# Patient Record
Sex: Male | Born: 1975 | Race: White | Hispanic: No | Marital: Married | Smoking: Current every day smoker
Health system: Southern US, Community
[De-identification: ages and names within clinical notes are randomized; demographics above are authoritative.]

## PROBLEM LIST (undated history)

## (undated) DIAGNOSIS — R569 Unspecified convulsions: Secondary | ICD-10-CM

## (undated) DIAGNOSIS — I10 Essential (primary) hypertension: Secondary | ICD-10-CM

## (undated) DIAGNOSIS — G40109 Localization-related (focal) (partial) symptomatic epilepsy and epileptic syndromes with simple partial seizures, not intractable, without status epilepticus: Secondary | ICD-10-CM

## (undated) DIAGNOSIS — F32A Depression, unspecified: Secondary | ICD-10-CM

## (undated) DIAGNOSIS — F329 Major depressive disorder, single episode, unspecified: Secondary | ICD-10-CM

## (undated) DIAGNOSIS — G43909 Migraine, unspecified, not intractable, without status migrainosus: Secondary | ICD-10-CM

## (undated) DIAGNOSIS — K642 Third degree hemorrhoids: Secondary | ICD-10-CM

## (undated) DIAGNOSIS — K589 Irritable bowel syndrome without diarrhea: Secondary | ICD-10-CM

## (undated) DIAGNOSIS — I499 Cardiac arrhythmia, unspecified: Secondary | ICD-10-CM

## (undated) DIAGNOSIS — K429 Umbilical hernia without obstruction or gangrene: Secondary | ICD-10-CM

## (undated) HISTORY — DX: Depression, unspecified: F32.A

## (undated) HISTORY — PX: OTHER SURGICAL HISTORY: SHX169

## (undated) HISTORY — DX: Localization-related (focal) (partial) symptomatic epilepsy and epileptic syndromes with simple partial seizures, not intractable, without status epilepticus: G40.109

## (undated) HISTORY — DX: Third degree hemorrhoids: K64.2

## (undated) HISTORY — DX: Essential (primary) hypertension: I10

## (undated) HISTORY — DX: Irritable bowel syndrome, unspecified: K58.9

## (undated) HISTORY — DX: Umbilical hernia without obstruction or gangrene: K42.9

## (undated) HISTORY — DX: Migraine, unspecified, not intractable, without status migrainosus: G43.909

---
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
## 2003-09-23 ENCOUNTER — Ambulatory Visit (HOSPITAL_COMMUNITY): Admission: RE | Admit: 2003-09-23 | Discharge: 2003-09-23 | Payer: Self-pay | Admitting: Unknown Physician Specialty

## 2004-06-20 ENCOUNTER — Ambulatory Visit: Payer: Self-pay | Admitting: Family Medicine

## 2004-07-22 ENCOUNTER — Ambulatory Visit (HOSPITAL_COMMUNITY): Admission: RE | Admit: 2004-07-22 | Discharge: 2004-07-22 | Payer: Self-pay | Admitting: *Deleted

## 2004-09-06 ENCOUNTER — Encounter: Admission: RE | Admit: 2004-09-06 | Discharge: 2004-09-06 | Payer: Self-pay | Admitting: Neurology

## 2004-11-01 ENCOUNTER — Encounter (INDEPENDENT_AMBULATORY_CARE_PROVIDER_SITE_OTHER): Payer: Self-pay | Admitting: *Deleted

## 2004-11-01 ENCOUNTER — Ambulatory Visit (HOSPITAL_COMMUNITY): Admission: RE | Admit: 2004-11-01 | Discharge: 2004-11-01 | Payer: Self-pay | Admitting: Cardiology

## 2007-01-15 ENCOUNTER — Ambulatory Visit: Payer: Self-pay | Admitting: Family Medicine

## 2009-11-02 ENCOUNTER — Ambulatory Visit (HOSPITAL_COMMUNITY): Admission: RE | Admit: 2009-11-02 | Discharge: 2009-11-02 | Payer: Self-pay | Admitting: Cardiology

## 2014-09-02 ENCOUNTER — Emergency Department (HOSPITAL_COMMUNITY): Payer: Managed Care, Other (non HMO)

## 2014-09-02 ENCOUNTER — Encounter (HOSPITAL_COMMUNITY): Payer: Self-pay | Admitting: Emergency Medicine

## 2014-09-02 ENCOUNTER — Emergency Department (HOSPITAL_COMMUNITY)
Admission: EM | Admit: 2014-09-02 | Discharge: 2014-09-02 | Disposition: A | Discharge status: Home / Self Care | Payer: Managed Care, Other (non HMO) | Attending: Emergency Medicine | Admitting: Emergency Medicine

## 2014-09-02 DIAGNOSIS — R000 Tachycardia, unspecified: Secondary | ICD-10-CM | POA: Insufficient documentation

## 2014-09-02 DIAGNOSIS — Z880 Allergy status to penicillin: Secondary | ICD-10-CM | POA: Diagnosis not present

## 2014-09-02 DIAGNOSIS — K644 Residual hemorrhoidal skin tags: Secondary | ICD-10-CM | POA: Diagnosis not present

## 2014-09-02 DIAGNOSIS — R1032 Left lower quadrant pain: Secondary | ICD-10-CM | POA: Diagnosis present

## 2014-09-02 DIAGNOSIS — Z720 Tobacco use: Secondary | ICD-10-CM | POA: Diagnosis not present

## 2014-09-02 DIAGNOSIS — Z8669 Personal history of other diseases of the nervous system and sense organs: Secondary | ICD-10-CM | POA: Diagnosis not present

## 2014-09-02 HISTORY — DX: Unspecified convulsions: R56.9

## 2014-09-02 LAB — TROPONIN I: TROPONIN I: 0.03 ng/mL (ref <0.031)

## 2014-09-02 LAB — PROTIME-INR
INR: 1.23 (ref 0.00–1.49)
PROTHROMBIN TIME: 15.6 s — AB (ref 11.6–15.2)

## 2014-09-02 LAB — CBC WITH DIFFERENTIAL/PLATELET
BASOS PCT: 0 % (ref 0–1)
Basophils Absolute: 0 10*3/uL (ref 0.0–0.1)
EOS ABS: 0 10*3/uL (ref 0.0–0.7)
EOS PCT: 0 % (ref 0–5)
HCT: 41.4 % (ref 39.0–52.0)
Hemoglobin: 14 g/dL (ref 13.0–17.0)
Lymphocytes Relative: 30 % (ref 12–46)
Lymphs Abs: 2.2 10*3/uL (ref 0.7–4.0)
MCH: 29 pg (ref 26.0–34.0)
MCHC: 33.8 g/dL (ref 30.0–36.0)
MCV: 85.7 fL (ref 78.0–100.0)
MONOS PCT: 13 % — AB (ref 3–12)
Monocytes Absolute: 1 10*3/uL (ref 0.1–1.0)
NEUTROS PCT: 57 % (ref 43–77)
Neutro Abs: 4.2 10*3/uL (ref 1.7–7.7)
Platelets: 138 10*3/uL — ABNORMAL LOW (ref 150–400)
RBC: 4.83 MIL/uL (ref 4.22–5.81)
RDW: 15.3 % (ref 11.5–15.5)
WBC: 7.5 10*3/uL (ref 4.0–10.5)

## 2014-09-02 LAB — COMPREHENSIVE METABOLIC PANEL
ALBUMIN: 4.3 g/dL (ref 3.5–5.2)
ALK PHOS: 85 U/L (ref 39–117)
ALT: 17 U/L (ref 0–53)
ANION GAP: 7 (ref 5–15)
AST: 26 U/L (ref 0–37)
BUN: 5 mg/dL — AB (ref 6–23)
CHLORIDE: 97 mmol/L (ref 96–112)
CO2: 27 mmol/L (ref 19–32)
Calcium: 9 mg/dL (ref 8.4–10.5)
Creatinine, Ser: 1.33 mg/dL (ref 0.50–1.35)
GFR calc non Af Amer: 66 mL/min — ABNORMAL LOW (ref >90)
GFR, EST AFRICAN AMERICAN: 77 mL/min — AB (ref >90)
Glucose, Bld: 133 mg/dL — ABNORMAL HIGH (ref 70–99)
POTASSIUM: 3.8 mmol/L (ref 3.5–5.1)
Sodium: 131 mmol/L — ABNORMAL LOW (ref 135–145)
TOTAL PROTEIN: 7.4 g/dL (ref 6.0–8.3)
Total Bilirubin: 1.5 mg/dL — ABNORMAL HIGH (ref 0.3–1.2)

## 2014-09-02 MED ORDER — DILTIAZEM HCL ER COATED BEADS 120 MG PO CP24: 120.0000 mg | ORAL_CAPSULE | Freq: Every day | ORAL | Status: DC

## 2014-09-02 MED ORDER — DILTIAZEM HCL ER COATED BEADS 120 MG PO CP24
120.0000 mg | ORAL_CAPSULE | Freq: Once | ORAL | Status: AC
  Administered 2014-09-02: 120 mg via ORAL
  Filled 2014-09-02: qty 1

## 2014-09-02 MED ORDER — DILTIAZEM HCL 100 MG IV SOLR
5.0000 mg/h | INTRAVENOUS | Status: DC
  Administered 2014-09-02: 5 mg/h via INTRAVENOUS

## 2014-09-02 MED ORDER — DILTIAZEM LOAD VIA INFUSION
15.0000 mg | Freq: Once | INTRAVENOUS | Status: AC
  Administered 2014-09-02: 15 mg via INTRAVENOUS
  Filled 2014-09-02: qty 15

## 2014-09-02 NOTE — ED Notes (Signed)
Pt c/o hemorrhoid pain x 2 days.

## 2014-09-02 NOTE — ED Provider Notes (Signed)
CSN: [id number]     Arrival date & time 09/02/14  0530 History   First MD Initiated Contact with Patient 09/02/14 [phone]     Chief Complaint  Patient presents with  . Hemorrhoids      HPI Patient presents to the emergency department complaining of hemorrhoid pain and a small amount of bleeding today.  He also reported that he developed a severe pain in his left groin.  On arrival to emergency department the patient has a heart rate in the 170s.  He denies chest pain shortness of breath.  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  He has no additional knowledge of his congenital heart disease.  He is to follow with cardiology but has not seen a cardiologist since 2006.  At that time it was Dr. Gery PrayBarry [place] in [place].  He denies medication use.  He denies exertional shortness of breath.  He states he was scheduled to have his hemorrhoid worked on in [facility] several months ago but when his heart rate was noted by the anesthesiologist the case was canceled and the patient was instructed that he would need cardiology follow-up prior to surgical procedure.  Past Medical History  Diagnosis Date  . Seizures    Past Surgical History  Procedure Laterality Date  . Pediatric bowel surgery    . Pediatric heart surgery     History reviewed. No pertinent family history. History  Substance Use Topics  . Smoking status: Current Every Day Smoker    Types: Cigarettes  . Smokeless tobacco: Not on file  . Alcohol Use: No    Review of Systems  All other systems reviewed and are negative.     Allergies  Aspirin; Nsaids; and Penicillins  Home Medications   Prior to Admission medications   Not on File   BP 116/65 mmHg  Pulse  148  Temp(Src) 98.3 F (36.8 C) (Oral)  Resp 26  Ht 5\' 11"  (1.803 m)  Wt 190 lb (86.183 kg)  BMI 26.51 kg/m2  SpO2 94% Physical Exam  Constitutional: He is oriented to person, place, and time. He appears well-developed and well-nourished.  HENT:  Head: Normocephalic and atraumatic.  Eyes: EOM are normal.  Neck: Normal range of motion.  Cardiovascular: Normal rate, regular rhythm, normal heart sounds and intact distal pulses.   Pulmonary/Chest: Effort normal and breath sounds normal. No respiratory distress.  Abdominal: Soft. He exhibits no distension. There is no tenderness.  Genitourinary:  Nonbleeding grade 2 hemorrhoid without tenderness.  Musculoskeletal: Normal range of motion.  Neurological: He is alert and oriented to person, place, and time.  Skin: Skin is warm and dry.  Psychiatric: He has a normal mood and affect. Judgment normal.  Nursing note and vitals reviewed.   ED Course  Procedures (including critical care time) Labs Review Labs Reviewed  CBC WITH DIFFERENTIAL/PLATELET - Abnormal; Notable for the following:    Platelets 138 (*)    Monocytes Relative 13 (*)    All other components within normal limits  COMPREHENSIVE METABOLIC PANEL - Abnormal; Notable for the following:    Sodium 131 (*)    Glucose, Bld 133 (*)    BUN 5 (*)    Total Bilirubin  1.5 (*)    GFR calc non Af Amer 66 (*)    GFR calc Af Amer 77 (*)    All other components within normal limits  PROTIME-INR - Abnormal; Notable for the following:    Prothrombin Time 15.6 (*)    All other components within normal limits  TROPONIN I    Imaging Review Dg Chest Portable 1 View  09/02/2014   CLINICAL DATA:  New onset atrial fibrillation.  Rapid heart rate.  EXAM: PORTABLE CHEST - 1 VIEW  COMPARISON:  None.  FINDINGS: Cardiac enlargement with normal pulmonary vascularity. Slight fibrosis or atelectasis in the right lung base. No focal consolidation. No pneumothorax.  IMPRESSION: Cardiac enlargement.  Fibrosis or atelectasis in the right lung base.   Electronically Signed   By: Burman Nieves M.D.   On: 09/02/2014 06:45  I personally reviewed the imaging tests through PACS system I reviewed available ER/hospitalization records through the EMR    EKG Interpretation #1  Date/Time:  Wednesday September 02 2014 06:02:58 EST Ventricular Rate:  178 PR Interval:  106 QRS Duration: 136 QT Interval:  277 QTC Calculation: 477 R Axis:   -91 Text Interpretation:  atrial fibrillation with RVR RBBB and LAFB Confirmed  by Dhiya Smits  MD, Caryn Bee ([id number]) on 09/02/2014 7:30:41 AM      MDM   Final diagnoses:  External hemorrhoid  Tachycardia    From a hemorrhoid standpoint the patient is fine to be discharged from the emergency department with Anusol and general surgery follow-up.  He will however need cardiac clearance for this.  I've asked that cardiology see the patient emergency department.  I think this could potentially be managed as an outpatient if cardiology were to follow along closely, schedule his appointment, and she medications.  It likely would be faster and easier the patient was admitted for workup to include echocardiogram to further define his cardiac anatomy.  Initial EKG appears to be A. fib with RVR with a right bundle branch block although at times I see occasional P waves through his EKG.  Patient was started on a Cardizem drip.  His heart rate is now improved to 112 and appears to be sinus rhythm at this time.  This is a difficult EKG to interpret given the fast heart rate.  At one point his heart rate while in the emergency department when up to 205.    Lyanne Co, MD 09/02/14 [phone]

## 2014-09-02 NOTE — Discharge Instructions (Signed)
If you were given medicines take as directed.  If you are on coumadin or contraceptives realize their levels and effectiveness is altered by many different medicines.  If you have any reaction (rash, tongues swelling, other) to the medicines stop taking and see a physician.   Please follow up as directed and return to the ER or see a physician for new or worsening symptoms.  Thank you. Filed Vitals:   09/02/14 0645 09/02/14 0650 09/02/14 0715 09/02/14 0730  BP: 104/64 116/65 72/59 113/83  Pulse: 75 148 118 112  Temp:      TempSrc:      Resp: 18 26 16 14   Height:      Weight:      SpO2: 94% 94% 93% 95%

## 2014-09-02 NOTE — ED Provider Notes (Signed)
Patient signed out to follow cardiology recommendations. Cardiology called and recommended outpatient follow-up, recommended standard release 120 mg once per day. Cardiology will help arrange appointment for the patient.  Filed Vitals:   09/02/14 0645 09/02/14 0650 09/02/14 0715 09/02/14 0730  BP: 104/64 116/65 72/59 113/83  Pulse: 75 148 118 112  Temp:      TempSrc:      Resp: 18 26 16 14   Height:      Weight:      SpO2: 94% 94% 93% 95%   Tachycardia, Hemorrhoid  Enid SkeensJoshua M Deedra Pro, MD 09/02/14 [id number]

## 2014-09-02 NOTE — ED Notes (Signed)
Pt handed an urinal to use 

## 2014-09-08 ENCOUNTER — Ambulatory Visit (INDEPENDENT_AMBULATORY_CARE_PROVIDER_SITE_OTHER): Payer: Managed Care, Other (non HMO) | Admitting: Cardiology

## 2014-09-08 ENCOUNTER — Encounter: Payer: Self-pay | Admitting: Cardiology

## 2014-09-08 VITALS — BP 118/62 | HR 82 | Ht 71.0 in | Wt 179.8 lb

## 2014-09-08 DIAGNOSIS — K648 Other hemorrhoids: Secondary | ICD-10-CM | POA: Insufficient documentation

## 2014-09-08 DIAGNOSIS — I4819 Other persistent atrial fibrillation: Secondary | ICD-10-CM

## 2014-09-08 DIAGNOSIS — I481 Persistent atrial fibrillation: Secondary | ICD-10-CM

## 2014-09-08 DIAGNOSIS — Q249 Congenital malformation of heart, unspecified: Secondary | ICD-10-CM

## 2014-09-08 LAB — TSH: TSH: 0.93 u[IU]/mL (ref 0.35–4.50)

## 2014-09-08 MED ORDER — DILTIAZEM HCL ER COATED BEADS 240 MG PO CP24: 240.0000 mg | ORAL_CAPSULE | Freq: Every day | ORAL | Status: DC

## 2014-09-08 NOTE — Progress Notes (Signed)
[address] [place], [place]  [address] Phone: [phone] Fax:  [phone]  Date:  09/08/2014   ID:  Cole Reynolds, DOB 02/25/1976, MRN [id number]  PCP:  No PCP Per Patient   History of Present Illness: Cole Reynolds is a 39 y.o. male here for evaluation of tachycardia, heart rate in the 170 range. His first EKG on 09/02/14 at 6 AM shows atrial fibrillation with heart rate in the 170 pattern with variable conduction. On repeat at 7 AM 09/02/14 EKG demonstrates what appears to be 2-1 atrial flutter pattern with heart rate of 120 bpm. 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 Perhaps AV canal defect. Question complete AV canal defect. 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 He saw Dr. Allyson SabalBerry years ago in 2006. Hemorrhoid surgery procedure was canceled by anesthesia.  ECHO 2006:  - Overall left ventricular systolic function was normal. Left    ventricular ejection fraction was estimated , range being 55    % to 65 %.. There were no left ventricular regional wall    motion abnormalities. - There was trivial aortic valvular regurgitation. - There was no left atrial appendage thrombus identified. - RU PV normal size and position with normal Doppler flow. RL    pulmonary vein normal size and position. No suggestion    anomalous right pulmonary veins. - There was no right-to-left shunt at rest by contrast study with    agitated saline. There was no right-to-left shunt with cough    by contrast study with agitated saline. There was no    right-to-left shunt during Valsalva maneuver by contrast    study with agitated saline. There was no evidence for left to    right shunt by contrast study with agitated saline (negative    contrast effect).  Cut out a lot  of caffeine [facility](Mountain Dew), and cut back on sugar (glucose 133 in emergency room) and smoker.  Dr. Allyson SabalBerry had on warfarin but he "didn't like it one bit."  The emergency room sent home on diltiazem 120 mg once a day.   Wt Readings from Last 3 Encounters:  09/08/14 179 lb 12.8 oz (81.557 kg)  09/02/14 190 lb (86.183 kg)     Past Medical History  Diagnosis Date  . Seizures     Past Surgical History  Procedure Laterality Date  . Pediatric bowel surgery    . Pediatric heart surgery      Current Outpatient Prescriptions  Medication Sig Dispense Refill  . diazepam (VALIUM) 10 MG tablet Take 10 mg by mouth daily.    Marland Kitchen. diltiazem (DILTIAZEM CD) 120 MG 24 hr capsule Take 1 capsule (120 mg total) by mouth daily. 30 capsule 0  . gabapentin (NEURONTIN) 600 MG tablet Take 600 mg by mouth 2 (two) times daily.     No current facility-administered medications for this visit.    Allergies:    Allergies  Allergen Reactions  . Aspirin Other (See Comments)    unknown  . Nsaids Hives  . Penicillins Other (See Comments)    unknown    Social History:  The patient  reports that he has been smoking Cigarettes.  He does not have any smokeless tobacco history on file. He reports that he uses illicit drugs (Marijuana).  He reports that he does not drink alcohol.   No early family history of coronary artery disease or congenital heart disease  ROS:  Please see the history of present illness.   Positive for pain associated with his hemorrhoid. No syncope, no strokelike symptoms. No rashes.   All other systems reviewed and negative.   PHYSICAL EXAM: VS:  BP 118/62 mmHg  Pulse 82  Ht  (1.803 m)  Wt 179 lb 12.8 oz (81.557 kg)  BMI 25.09 kg/m2 Well nourished, well developed, in no acute distress HEENT: normal, [place]/AT, EOMI Neck: no JVD, normal carotid upstroke, no bruit Cardiac:  normal S1, S2; RRR; no murmur Lungs:  clear to auscultation bilaterally, no wheezing, rhonchi or rales Abd: soft,  nontender, no hepatomegaly, no bruits Ext: no edema, 2+ distal pulses Skin: warm and dry GU: deferred Neuro: no focal abnormalities noted, AAO x 3  EKG:  Atrial fibrillation with rapid ventricular response heart rate in the 170s on 09/02/14-repeat appears to be 2-1 atrial flutter   120  ASSESSMENT AND PLAN:  1. Atrial fibrillation/flutter-this goes hand-in-hand with his underlying congenital heart disease. Apparently cathetered by history he has had atrial fibrillation for several years. At one point in 2006 he had been on warfarin therapy but discontinued. CHADS-VASc - 0. He has never had a prior stroke. He is not interested in anticoagulation or anything that would make his hemorrhoidal bleeding worse. Ultimately, my goal is to provide adequate rate control for his atrial fibrillation would then allow for him to have hemorrhoid repair. We will increase his diltiazem from 120 up to 240 mg once a day. I will see him back in a few days. If necessary, we will increase this to 360. We also add metoprolol if necessary in the future for further rate control. It would be my suspicion that given his long-standing history of atrial fibrillation that maintenance of sinus rhythm would be quite challenging, especially given his underlying history of congenital heart defect with repair. If we are unsuccessful at rate control, I may ask electrophysiology colleagues to ponder further options. I will repeat echocardiogram since it is been over 10 years and I will also check TSH. 2. We will see him back on Friday  Signed, Donato Schultz, MD [facility]  09/08/2014 10:24 AM

## 2014-09-08 NOTE — Patient Instructions (Signed)
Please increase your Diltiazem to 240 mg a day. Continue all other medications as listed.  Your physician has requested that you have an echocardiogram. Echocardiography is a painless test that uses sound waves to create images of your heart. It provides your doctor with information about the size and shape of your heart and how well your heart's chambers and valves are working. This procedure takes approximately one hour. There are no restrictions for this procedure.  Please have blood work today  (TSH).  Follow up in 2 to 3 days with Dr Anne FuSkains to follow up heart rate/ At Fib.

## 2014-09-09 ENCOUNTER — Ambulatory Visit (HOSPITAL_COMMUNITY): Payer: Managed Care, Other (non HMO) | Attending: Cardiology

## 2014-09-09 DIAGNOSIS — I481 Persistent atrial fibrillation: Secondary | ICD-10-CM | POA: Insufficient documentation

## 2014-09-09 DIAGNOSIS — I4819 Other persistent atrial fibrillation: Secondary | ICD-10-CM

## 2014-09-09 DIAGNOSIS — I4891 Unspecified atrial fibrillation: Secondary | ICD-10-CM

## 2014-09-09 NOTE — Progress Notes (Signed)
2D Echo completed. 09/09/2014 

## 2014-09-10 ENCOUNTER — Telehealth: Payer: Self-pay | Admitting: Cardiology

## 2014-09-10 ENCOUNTER — Other Ambulatory Visit: Payer: Self-pay | Admitting: *Deleted

## 2014-09-10 MED ORDER — DIGOXIN 125 MCG PO TABS: 0.1250 mg | ORAL_TABLET | Freq: Every day | ORAL | Status: DC

## 2014-09-10 MED ORDER — METOPROLOL SUCCINATE ER 100 MG PO TB24: 100.0000 mg | ORAL_TABLET | Freq: Every day | ORAL | Status: DC

## 2014-09-10 NOTE — Telephone Encounter (Signed)
Pt calling because he wanted to know if he should start his new medications today.  Advised that he should start them this evening when he picks them up.  He states understanding.

## 2014-09-10 NOTE — Telephone Encounter (Signed)
New message      [facility]

## 2014-09-11 ENCOUNTER — Ambulatory Visit (INDEPENDENT_AMBULATORY_CARE_PROVIDER_SITE_OTHER): Payer: Managed Care, Other (non HMO) | Admitting: Cardiology

## 2014-09-11 ENCOUNTER — Encounter: Payer: Self-pay | Admitting: Cardiology

## 2014-09-11 ENCOUNTER — Ambulatory Visit: Payer: Managed Care, Other (non HMO) | Admitting: Cardiology

## 2014-09-11 VITALS — BP 100/72 | HR 104 | Ht 71.0 in | Wt 181.0 lb

## 2014-09-11 DIAGNOSIS — I5021 Acute systolic (congestive) heart failure: Secondary | ICD-10-CM | POA: Insufficient documentation

## 2014-09-11 DIAGNOSIS — I481 Persistent atrial fibrillation: Secondary | ICD-10-CM

## 2014-09-11 DIAGNOSIS — I4819 Other persistent atrial fibrillation: Secondary | ICD-10-CM

## 2014-09-11 MED ORDER — FUROSEMIDE 20 MG PO TABS: 20.0000 mg | ORAL_TABLET | Freq: Every day | ORAL | Status: DC

## 2014-09-11 NOTE — Progress Notes (Signed)
[address] [place], [place]  [address] Phone: [phone](336) [phone] Fax:  [phone](336) [phone]  Date:  09/11/2014   ID:  Cole Reynolds, DOB 05/17/1976, MRN [id number]  PCP:  No PCP Per Patient   History of Present Illness: Cole Reynolds is a 39 y.o. male here for lump of atrial fibrillation/flutter with rapid ventricular response up to 170 bpm. Originally he was placed on diltiazem however when echocardiogram was performed, his ejection fraction had reduced from 60% 10 years ago down to 15%. Because of this, we transitioned him over to metoprolol and digoxin. Rate control today is better. 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 He is feeling some orthopnea that is periodic.   Prior heart rate in the 170 range. His first EKG on 09/02/14 at 6 AM shows atrial fibrillation with heart rate in the 170 pattern with variable conduction. On repeat at 7 AM 09/02/14 EKG demonstrates what appears to be 2-1 atrial flutter pattern with heart rate of 120 bpm. 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 Perhaps AV canal defect. Question complete AV canal defect. 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 He saw Dr. Allyson SabalBerry years ago in 2006. Hemorrhoid surgery procedure was canceled by anesthesia.  ECHO 2006: Normal ejection fraction, no shunt with agitated saline.  Echocardiogram 2016-EF 15%  - Cut out a lot of caffeine [facility](Mountain Dew), and cut back on sugar (glucose 133 in emergency room) and smoker.  Dr. Allyson SabalBerry had on warfarin but he "didn't like it one bit."  The emergency room sent home on diltiazem 120 mg once a day.   Wt Readings from Last 3 Encounters:  09/11/14 181 lb (82.101 kg)  09/08/14 179 lb 12.8 oz (81.557 kg)  09/02/14 190 lb (86.183 kg)     Past Medical History  Diagnosis Date  . Seizures     Past  Surgical History  Procedure Laterality Date  . Pediatric bowel surgery    . Pediatric heart surgery      Current Outpatient Prescriptions  Medication Sig Dispense Refill  . diazepam (VALIUM) 10 MG tablet Take 10 mg by mouth daily.    . digoxin (LANOXIN) 0.125 MG tablet Take 1 tablet (0.125 mg total) by mouth daily. 30 tablet 6  . gabapentin (NEURONTIN) 600 MG tablet Take 600 mg by mouth 2 (two) times daily.    . metoprolol succinate (TOPROL-XL) 100 MG 24 hr tablet Take 1 tablet (100 mg total) by mouth daily. Take with or immediately following a meal. 30 tablet 6   No current facility-administered medications for this visit.    Allergies:    Allergies  Allergen Reactions  . Aspirin Other (See Comments)    unknown  . Nsaids Hives  . Penicillins Other (See Comments)    unknown    Social History:  The patient  reports that he has been smoking Cigarettes.  He does not have any smokeless tobacco history on file. He reports that he uses illicit drugs (Marijuana). He reports that he does not drink alcohol.   No early family history of coronary artery disease or congenital heart disease  ROS:  Please see the history of present illness.   Positive for pain associated with his hemorrhoid. No syncope, no strokelike symptoms. No rashes.   All other  systems reviewed and negative.   PHYSICAL EXAM: VS:  BP 100/72 mmHg  Pulse 104  Ht  (1.803 m)  Wt 181 lb (82.101 kg)  BMI 25.26 kg/m2 Well nourished, well developed, in no acute distress HEENT: normal, [place]/AT, EOMI Neck: noobvious JVD, normal carotid upstroke, no bruit Cardiac:  normal S1, S2; RRR; no murmur Lungs:  clear to auscultation bilaterally, no wheezing, minimal right lower lobe crackles  Abd: soft, nontender, no hepatomegaly, no bruits Ext: no edema, 2+ distal pulses Skin: warm and dry GU: deferred Neuro: no focal abnormalities noted, AAO x 3  EKG:  Atrial fibrillation with rapid ventricular response heart rate in the  170s on 09/02/14-repeat appears to be 2-1 atrial flutter   120  ASSESSMENT AND PLAN:  1. Atrial fibrillation/flutter-has had rapid ventricular response. 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 His ejection fraction currently is 15%. 10 years ago it was normal. We switched him from diltiazem over to Toprol because of his cardiomyopathy. After's first dose of Toprol he did feel nausea and had an episode of vomiting/diarrhea. We have asked him to continue to try to take this medication. He has had some orthopnea as well. This is periodic. He is also a heavy smoker. He has mild right lower lobe crackles on exam. I will give him Lasix 20 mg once a day. Last creatinine was 1.3. His atrial fibrillation/flutter goes hand-in-hand with his underlying congenital heart disease. Apparently by history he has had atrial fibrillation for several years. At one point in 2006 he had been on warfarin therapy but discontinued. CHADS-VASc - 1. With newly discovered ejection fraction of 15%. He has never had a prior stroke. He is not interested in anticoagulation or anything that would make his hemorrhoidal bleeding worse. Ultimately, my goal is to provide adequate rate control for his atrial fibrillation would then allow for him to have hemorrhoid repair. It would be my suspicion that given his long-standing history of atrial fibrillation that maintenance of sinus rhythm would be quite challenging, especially given his underlying history of congenital heart defect with repair. If we are unsuccessful at rate control, I may ask electrophysiology colleagues to ponder further options. TSH is normal. 2. We will see him back week. I've told him to have a low threshold to go to the emergency room if his symptoms worsen.  Signed, Donato Schultz, MD [facility]  09/11/2014 9:02 AM

## 2014-09-11 NOTE — Patient Instructions (Addendum)
Please start Furosemide 20 mg a day.  You will want to take this medication in the morning. Continue all other medications as listed.  Increase food high in potassium as discussed.  You will have blood work next week to check your potassium level and kidney functions.  Follow up in 1 week with Dr Anne FuSkains.  Thank you for choosing [facility]!!

## 2014-09-16 ENCOUNTER — Ambulatory Visit (INDEPENDENT_AMBULATORY_CARE_PROVIDER_SITE_OTHER): Payer: Managed Care, Other (non HMO) | Admitting: Cardiology

## 2014-09-16 ENCOUNTER — Encounter: Payer: Self-pay | Admitting: Cardiology

## 2014-09-16 VITALS — BP 90/58 | HR 139 | Ht 71.0 in | Wt 177.0 lb

## 2014-09-16 DIAGNOSIS — I4892 Unspecified atrial flutter: Secondary | ICD-10-CM

## 2014-09-16 NOTE — Patient Instructions (Signed)
Your physician recommends that you schedule a follow-up appointment in: 1 month with Dr. Skains  Your physician recommends that you continue on your current medications as directed. Please refer to the Current Medication list given to you today.  
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
## 2014-10-20 ENCOUNTER — Ambulatory Visit (INDEPENDENT_AMBULATORY_CARE_PROVIDER_SITE_OTHER): Payer: Managed Care, Other (non HMO) | Admitting: Cardiology

## 2014-10-20 ENCOUNTER — Encounter: Payer: Self-pay | Admitting: Cardiology

## 2014-10-20 VITALS — BP 102/69 | HR 95 | Ht 71.0 in | Wt 183.0 lb

## 2014-10-20 DIAGNOSIS — Z720 Tobacco use: Secondary | ICD-10-CM

## 2014-10-20 DIAGNOSIS — I481 Persistent atrial fibrillation: Secondary | ICD-10-CM

## 2014-10-20 DIAGNOSIS — I420 Dilated cardiomyopathy: Secondary | ICD-10-CM | POA: Insufficient documentation

## 2014-10-20 DIAGNOSIS — I4819 Other persistent atrial fibrillation: Secondary | ICD-10-CM

## 2014-10-20 DIAGNOSIS — Q249 Congenital malformation of heart, unspecified: Secondary | ICD-10-CM

## 2014-10-20 LAB — BASIC METABOLIC PANEL
BUN: 7 mg/dL (ref 6–23)
CALCIUM: 9.5 mg/dL (ref 8.4–10.5)
CO2: 31 meq/L (ref 19–32)
Chloride: 107 mEq/L (ref 96–112)
Creatinine, Ser: 1.03 mg/dL (ref 0.40–1.50)
GFR: 85.25 mL/min (ref >60.00)
GLUCOSE: 88 mg/dL (ref 70–99)
Potassium: 3.5 mEq/L (ref 3.5–5.1)
Sodium: 141 mEq/L (ref 135–145)

## 2014-10-20 MED ORDER — METOPROLOL SUCCINATE ER 50 MG PO TB24: 50.0000 mg | ORAL_TABLET | Freq: Every day | ORAL | Status: DC
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
## 2014-10-20 NOTE — Patient Instructions (Signed)
Please take Metoprolol 50 mg in the AM and 100 mg in the PM. Continue all other medications as listed.  Please have blood work today (BMP)  Your physician has requested that you have an echocardiogram in 6 weeks. Echocardiography is a painless test that uses sound waves to create images of your heart. It provides your doctor with information about the size and shape of your heart and how well your heart's chambers and valves are working. This procedure takes approximately one hour. There are no restrictions for this procedure.  Follow up in 6 weeks with Dr Anne FuSkains.  Thank you for choosing [facility]!!

## 2014-12-01 ENCOUNTER — Ambulatory Visit (HOSPITAL_COMMUNITY): Payer: Managed Care, Other (non HMO) | Attending: Cardiology | Admitting: Cardiology

## 2014-12-01 ENCOUNTER — Ambulatory Visit (INDEPENDENT_AMBULATORY_CARE_PROVIDER_SITE_OTHER): Payer: Managed Care, Other (non HMO) | Admitting: Cardiology

## 2014-12-01 ENCOUNTER — Encounter: Payer: Self-pay | Admitting: Cardiology

## 2014-12-01 VITALS — BP 98/70 | HR 52 | Ht 71.0 in | Wt 191.2 lb

## 2014-12-01 DIAGNOSIS — I4819 Other persistent atrial fibrillation: Secondary | ICD-10-CM

## 2014-12-01 DIAGNOSIS — I480 Paroxysmal atrial fibrillation: Secondary | ICD-10-CM | POA: Diagnosis not present

## 2014-12-01 DIAGNOSIS — I481 Persistent atrial fibrillation: Secondary | ICD-10-CM

## 2014-12-01 DIAGNOSIS — Z720 Tobacco use: Secondary | ICD-10-CM | POA: Insufficient documentation

## 2014-12-01 DIAGNOSIS — I420 Dilated cardiomyopathy: Secondary | ICD-10-CM

## 2014-12-01 DIAGNOSIS — I484 Atypical atrial flutter: Secondary | ICD-10-CM

## 2014-12-01 DIAGNOSIS — Q249 Congenital malformation of heart, unspecified: Secondary | ICD-10-CM | POA: Diagnosis not present

## 2014-12-01 MED ORDER — RIVAROXABAN 20 MG PO TABS: 20.0000 mg | ORAL_TABLET | Freq: Every day | ORAL | Status: DC

## 2014-12-01 NOTE — Patient Instructions (Addendum)
Medication Instructions:  Start Xarelto 20 mg a day. Continue all other medications as listed.  Labwork: NONE today  Testing/Procedures: Your physician has requested that you have a Cardioversion.  Electrical Cardioversion uses a jolt of electricity to your heart either through paddles or wired patches attached to your chest. This is a controlled, usually prescheduled, procedure. This procedure is done at the hospital and you are not awake during the procedure. You usually go home the day of the procedure. Please see the instruction sheet given to you today for more information. (possibly May 19th)  Follow-Up:  2 weeks after cardioversion.  Thank you for choosing [facility]!!
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
## 2014-12-01 NOTE — Progress Notes (Signed)
Limited echo performed. 

## 2014-12-21 ENCOUNTER — Telehealth: Payer: Self-pay | Admitting: *Deleted

## 2014-12-21 ENCOUNTER — Encounter: Payer: Self-pay | Admitting: *Deleted

## 2014-12-21 NOTE — Telephone Encounter (Signed)
Spoke with wife who is agreeable to schedule patient for Thursday.   Called central scheduling - pt is on for Thursday 5/19 at 11 am case # [id number]  Wife is aware and instruction reviewed with her.  She stated understanding.

## 2014-12-21 NOTE — Telephone Encounter (Signed)
Spoke with pt who reports he has not missed any doses of Xarelto.  He is aware he is ready to be scheduled for out pt cardioversion and Dr Anne FuSkains is available to do this on Thursday this week.  Pt would like his wife to call back to schedule the cardioversion.  I will await her call.

## 2014-12-24 ENCOUNTER — Encounter (HOSPITAL_COMMUNITY): Payer: Self-pay | Admitting: *Deleted

## 2014-12-24 ENCOUNTER — Ambulatory Visit (HOSPITAL_COMMUNITY): Payer: Managed Care, Other (non HMO) | Admitting: Anesthesiology

## 2014-12-24 ENCOUNTER — Encounter (HOSPITAL_COMMUNITY)
Admission: RE | Disposition: A | Discharge status: Home / Self Care | Payer: Self-pay | Source: Ambulatory Visit | Attending: Cardiology

## 2014-12-24 ENCOUNTER — Ambulatory Visit (HOSPITAL_COMMUNITY)
Admission: RE | Admit: 2014-12-24 | Discharge: 2014-12-24 | Disposition: A | Discharge status: Home / Self Care | Payer: Managed Care, Other (non HMO) | Source: Ambulatory Visit | Attending: Cardiology | Admitting: Cardiology

## 2014-12-24 DIAGNOSIS — I4819 Other persistent atrial fibrillation: Secondary | ICD-10-CM | POA: Diagnosis present

## 2014-12-24 DIAGNOSIS — I481 Persistent atrial fibrillation: Secondary | ICD-10-CM

## 2014-12-24 DIAGNOSIS — I429 Cardiomyopathy, unspecified: Secondary | ICD-10-CM | POA: Insufficient documentation

## 2014-12-24 DIAGNOSIS — Z880 Allergy status to penicillin: Secondary | ICD-10-CM | POA: Diagnosis not present

## 2014-12-24 DIAGNOSIS — I4892 Unspecified atrial flutter: Secondary | ICD-10-CM | POA: Insufficient documentation

## 2014-12-24 DIAGNOSIS — I4891 Unspecified atrial fibrillation: Secondary | ICD-10-CM | POA: Diagnosis not present

## 2014-12-24 DIAGNOSIS — Z888 Allergy status to other drugs, medicaments and biological substances status: Secondary | ICD-10-CM | POA: Diagnosis not present

## 2014-12-24 DIAGNOSIS — F1210 Cannabis abuse, uncomplicated: Secondary | ICD-10-CM | POA: Diagnosis not present

## 2014-12-24 DIAGNOSIS — F17210 Nicotine dependence, cigarettes, uncomplicated: Secondary | ICD-10-CM | POA: Diagnosis not present

## 2014-12-24 HISTORY — PX: CARDIOVERSION: SHX1299

## 2014-12-24 HISTORY — DX: Cardiac arrhythmia, unspecified: I49.9

## 2014-12-24 LAB — POCT I-STAT 4, (NA,K, GLUC, HGB,HCT)
Glucose, Bld: 112 mg/dL — ABNORMAL HIGH (ref 65–99)
HCT: 47 % (ref 39.0–52.0)
HEMOGLOBIN: 16 g/dL (ref 13.0–17.0)
POTASSIUM: 3.8 mmol/L (ref 3.5–5.1)
SODIUM: 141 mmol/L (ref 135–145)

## 2014-12-24 SURGERY — CARDIOVERSION
Anesthesia: Monitor Anesthesia Care

## 2014-12-24 MED ORDER — SODIUM CHLORIDE 0.9 % IV SOLN
INTRAVENOUS | Status: DC
Start: 2014-12-24 — End: 2014-12-24
  Administered 2014-12-24: 10:00:00 via INTRAVENOUS

## 2014-12-24 MED ORDER — ETOMIDATE 2 MG/ML IV SOLN
INTRAVENOUS | Status: DC | PRN
  Administered 2014-12-24: 6 mg via INTRAVENOUS

## 2014-12-24 MED ORDER — PROPOFOL 10 MG/ML IV BOLUS
INTRAVENOUS | Status: DC | PRN
  Administered 2014-12-24: 20 mg via INTRAVENOUS
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
## 2014-12-24 NOTE — Anesthesia Preprocedure Evaluation (Addendum)
Anesthesia Evaluation  Patient identified by MRN, date of birth, ID band Patient awake    Reviewed: Allergy & Precautions, NPO status , reviewed documented beta blocker date and time   Airway Mallampati: II  TM Distance: >3 FB Neck ROM: Full    Dental  (+) Edentulous Upper, Edentulous Lower   Pulmonary Current Smoker,  breath sounds clear to auscultation        Cardiovascular Exercise Tolerance: Poor Rhythm:Regular Rate:Normal  Hx of congentital defect, ? ASD repair as a child   Neuro/Psych    GI/Hepatic negative GI ROS, Neg liver ROS,   Endo/Other  negative endocrine ROS  Renal/GU negative Renal ROS     Musculoskeletal   Abdominal   Peds  Hematology   Anesthesia Other Findings   Reproductive/Obstetrics                            Anesthesia Physical Anesthesia Plan  ASA: III  Anesthesia Plan: MAC   Post-op Pain Management:    Induction:   Airway Management Planned: Simple Face Mask  Additional Equipment:   Intra-op Plan:   Post-operative Plan:   Informed Consent: I have reviewed the patients History and Physical, chart, labs and discussed the procedure including the risks, benefits and alternatives for the proposed anesthesia with the patient or authorized representative who has indicated his/her understanding and acceptance.   Dental advisory given  Plan Discussed with: CRNA, Anesthesiologist and Surgeon  Anesthesia Plan Comments:         Anesthesia Quick Evaluation

## 2014-12-24 NOTE — CV Procedure (Signed)
    Electrical Cardioversion Procedure Note Cole Reynolds [id number] 09/12/1975  Procedure: Electrical Cardioversion Indications:  Atrial Fibrillation Flutter  Time Out: Verified patient identification, verified procedure,medications/allergies/relevent history reviewed, required imaging and test results available.  Performed  Procedure Details  The patient was NPO after midnight. Anesthesia was administered at the beside  by Dr.Jackson with atomidate and propofol.  Cardioversion was performed with synchronized biphasic defibrillation via AP pads with 120 joules.  1 attempt(s) were performed.  The patient converted to normal sinus rhythm. The patient tolerated the procedure well   IMPRESSION:  Successful cardioversion of atrial fibrillation/ slow flutter. Clear P waves now preceding QRS. Hopefully will maintain SR. EF 15%. See last clinic note. Discussed with Dr. Ladona Ridgelaylor. If EF does not improve, ICD.   Continue anticoagulation.    SKAINS, MARK 12/24/2014, 11:23 AM

## 2014-12-24 NOTE — Interval H&P Note (Signed)
History and Physical Interval Note:  12/24/2014 11:07 AM  Minda MeoEdward O Barbato  has presented today for surgery, with the diagnosis of A FIB   The various methods of treatment have been discussed with the patient and family. After consideration of risks, benefits and other options for treatment, the patient has consented to  Procedure(s): CARDIOVERSION (N/A) as a surgical intervention .  The patient's history has been reviewed, patient examined, no change in status, stable for surgery.  I have reviewed the patient's chart and labs.  Questions were answered to the patient's satisfaction.     SKAINS, MARK

## 2014-12-24 NOTE — Transfer of Care (Signed)
Immediate Anesthesia Transfer of Care Note  Patient: Cole Reynolds  Procedure(s) Performed: Procedure(s): CARDIOVERSION (N/A)  Patient Location: PACU  Anesthesia Type:MAC  Level of Consciousness: awake and patient cooperative  Airway & Oxygen Therapy: Patient Spontanous Breathing and Patient connected to nasal cannula oxygen  Post-op Assessment: Report given to RN, Post -op Vital signs reviewed and stable and Patient moving all extremities  Post vital signs: Reviewed and stable  Last Vitals:  Filed Vitals:   12/24/14 1111  BP: 114/91  Pulse:   Temp:   Resp: 16    Complications: No apparent anesthesia complications

## 2014-12-24 NOTE — Anesthesia Postprocedure Evaluation (Signed)
  Anesthesia Post-op Note  Patient: Cole Reynolds  Procedure(s) Performed: Procedure(s): CARDIOVERSION (N/A)  Patient Location: PACU  Anesthesia Type:MAC  Level of Consciousness: awake, alert  and patient cooperative  Airway and Oxygen Therapy: Patient Spontanous Breathing and Patient connected to nasal cannula oxygen  Post-op Pain: none  Post-op Assessment: Post-op Vital signs reviewed, Patient's Cardiovascular Status Stable, Respiratory Function Stable, Patent Airway and No signs of Nausea or vomiting  Post-op Vital Signs: Reviewed and stable  Last Vitals:  Filed Vitals:   12/24/14 1111  BP: 114/91  Pulse:   Temp:   Resp: 16    Complications: No apparent anesthesia complications

## 2014-12-25 ENCOUNTER — Encounter (HOSPITAL_COMMUNITY): Payer: Self-pay | Admitting: Cardiology

## 2014-12-26 ENCOUNTER — Encounter (HOSPITAL_COMMUNITY): Payer: Self-pay | Admitting: Cardiology

## 2015-01-08 ENCOUNTER — Ambulatory Visit: Payer: Managed Care, Other (non HMO) | Admitting: Cardiology

## 2015-01-22 ENCOUNTER — Encounter: Payer: Self-pay | Admitting: Cardiology

## 2015-03-30 ENCOUNTER — Ambulatory Visit (INDEPENDENT_AMBULATORY_CARE_PROVIDER_SITE_OTHER): Payer: Managed Care, Other (non HMO) | Admitting: Cardiology

## 2015-03-30 ENCOUNTER — Encounter: Payer: Self-pay | Admitting: Cardiology

## 2015-03-30 VITALS — BP 118/50 | HR 51 | Ht 71.0 in | Wt 191.8 lb

## 2015-03-30 DIAGNOSIS — I481 Persistent atrial fibrillation: Secondary | ICD-10-CM

## 2015-03-30 DIAGNOSIS — I420 Dilated cardiomyopathy: Secondary | ICD-10-CM | POA: Diagnosis not present

## 2015-03-30 DIAGNOSIS — Z720 Tobacco use: Secondary | ICD-10-CM | POA: Diagnosis not present

## 2015-03-30 DIAGNOSIS — Q249 Congenital malformation of heart, unspecified: Secondary | ICD-10-CM

## 2015-03-30 DIAGNOSIS — I4819 Other persistent atrial fibrillation: Secondary | ICD-10-CM

## 2015-03-30 NOTE — Patient Instructions (Signed)
Medication Instructions:  Your physician recommends that you continue on your current medications as directed. Please refer to the Current Medication list given to you today.  Testing/Procedures: Your physician has requested that you have an echocardiogram. Echocardiography is a painless test that uses sound waves to create images of your heart. It provides your doctor with information about the size and shape of your heart and how well your heart's chambers and valves are working. This procedure takes approximately one hour. There are no restrictions for this procedure.  Follow-Up: Follow up in 4 months with Dr Skains.  Thank you for choosing [facility]!!     
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
## 2015-03-30 NOTE — Addendum Note (Signed)
Addended by: Sydnee Cabal R on: 03/30/2015 08:40 AM   Modules accepted: Orders

## 2015-04-01 ENCOUNTER — Other Ambulatory Visit: Payer: Self-pay

## 2015-04-01 ENCOUNTER — Ambulatory Visit (HOSPITAL_COMMUNITY): Payer: Managed Care, Other (non HMO) | Attending: Cardiology

## 2015-04-01 DIAGNOSIS — I351 Nonrheumatic aortic (valve) insufficiency: Secondary | ICD-10-CM | POA: Diagnosis not present

## 2015-04-01 DIAGNOSIS — I420 Dilated cardiomyopathy: Secondary | ICD-10-CM | POA: Insufficient documentation

## 2015-04-01 DIAGNOSIS — I77819 Aortic ectasia, unspecified site: Secondary | ICD-10-CM | POA: Insufficient documentation

## 2015-04-01 DIAGNOSIS — I071 Rheumatic tricuspid insufficiency: Secondary | ICD-10-CM | POA: Insufficient documentation

## 2015-04-02 NOTE — Addendum Note (Signed)
Addended by: Reesa Chew on: 04/02/2015 02:37 PM   Modules accepted: Orders

## 2015-04-17 ENCOUNTER — Other Ambulatory Visit: Payer: Self-pay | Admitting: Cardiology

## 2015-05-18 ENCOUNTER — Other Ambulatory Visit: Payer: Self-pay | Admitting: Cardiology

## 2015-06-21 ENCOUNTER — Encounter: Payer: Self-pay | Admitting: Cardiology

## 2015-07-03 ENCOUNTER — Other Ambulatory Visit: Payer: Self-pay | Admitting: Cardiology

## 2015-07-14 ENCOUNTER — Ambulatory Visit: Payer: Managed Care, Other (non HMO) | Admitting: Cardiology

## 2015-07-23 ENCOUNTER — Ambulatory Visit: Payer: Managed Care, Other (non HMO) | Admitting: Cardiology

## 2015-08-18 ENCOUNTER — Ambulatory Visit (INDEPENDENT_AMBULATORY_CARE_PROVIDER_SITE_OTHER): Payer: Managed Care, Other (non HMO) | Admitting: Cardiology

## 2015-08-18 ENCOUNTER — Encounter: Payer: Self-pay | Admitting: Cardiology

## 2015-08-18 VITALS — BP 136/72 | HR 115 | Ht 71.0 in | Wt 176.4 lb

## 2015-08-18 DIAGNOSIS — I420 Dilated cardiomyopathy: Secondary | ICD-10-CM

## 2015-08-18 DIAGNOSIS — Z720 Tobacco use: Secondary | ICD-10-CM

## 2015-08-18 DIAGNOSIS — Q249 Congenital malformation of heart, unspecified: Secondary | ICD-10-CM

## 2015-08-18 DIAGNOSIS — I481 Persistent atrial fibrillation: Secondary | ICD-10-CM

## 2015-08-18 DIAGNOSIS — I4819 Other persistent atrial fibrillation: Secondary | ICD-10-CM
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
## 2015-08-18 NOTE — Patient Instructions (Signed)
Medication Instructions:  Your physician recommends that you continue on your current medications as directed. Please refer to the Current Medication list given to you today.   Labwork: Your physician recommends that you return for lab work in: Lab work - (bmet/cbc)   Testing/Procedures: none  Follow-Up: Your physician wants you to follow-up in: 4 months with [facility]. You will receive a reminder letter in the mail two months in advance. If you don't receive a letter, please call our office to schedule the follow-up appointment.   Any Other Special Instructions Will Be Listed Below (If Applicable).     If you need a refill on your cardiac medications before your next appointment, please call your pharmacy.

## 2015-08-19 ENCOUNTER — Other Ambulatory Visit (INDEPENDENT_AMBULATORY_CARE_PROVIDER_SITE_OTHER): Payer: Managed Care, Other (non HMO) | Admitting: *Deleted

## 2015-08-19 DIAGNOSIS — I420 Dilated cardiomyopathy: Secondary | ICD-10-CM | POA: Diagnosis not present

## 2015-08-19 DIAGNOSIS — Z720 Tobacco use: Secondary | ICD-10-CM

## 2015-08-19 DIAGNOSIS — I481 Persistent atrial fibrillation: Secondary | ICD-10-CM | POA: Diagnosis not present

## 2015-08-19 DIAGNOSIS — Q249 Congenital malformation of heart, unspecified: Secondary | ICD-10-CM

## 2015-08-19 DIAGNOSIS — K648 Other hemorrhoids: Secondary | ICD-10-CM | POA: Diagnosis not present

## 2015-08-19 DIAGNOSIS — I4819 Other persistent atrial fibrillation: Secondary | ICD-10-CM

## 2015-08-19 LAB — CBC WITH DIFFERENTIAL/PLATELET
BASOS ABS: 0 10*3/uL (ref 0.0–0.1)
Basophils Relative: 0 % (ref 0–1)
Eosinophils Absolute: 0.1 10*3/uL (ref 0.0–0.7)
Eosinophils Relative: 1 % (ref 0–5)
HEMATOCRIT: 43.6 % (ref 39.0–52.0)
HEMOGLOBIN: 14.9 g/dL (ref 13.0–17.0)
LYMPHS ABS: 2.6 10*3/uL (ref 0.7–4.0)
LYMPHS PCT: 29 % (ref 12–46)
MCH: 30.4 pg (ref 26.0–34.0)
MCHC: 34.2 g/dL (ref 30.0–36.0)
MCV: 89 fL (ref 78.0–100.0)
MPV: 11.5 fL (ref 8.6–12.4)
Monocytes Absolute: 0.7 10*3/uL (ref 0.1–1.0)
Monocytes Relative: 8 % (ref 3–12)
NEUTROS PCT: 62 % (ref 43–77)
Neutro Abs: 5.6 10*3/uL (ref 1.7–7.7)
Platelets: 216 10*3/uL (ref 150–400)
RBC: 4.9 MIL/uL (ref 4.22–5.81)
RDW: 13.9 % (ref 11.5–15.5)
WBC: 9 10*3/uL (ref 4.0–10.5)

## 2015-08-19 LAB — BASIC METABOLIC PANEL
BUN: 7 mg/dL (ref 7–25)
CHLORIDE: 105 mmol/L (ref 98–110)
CO2: 25 mmol/L (ref 20–31)
Calcium: 9.4 mg/dL (ref 8.6–10.3)
Creat: 0.92 mg/dL (ref 0.60–1.35)
Glucose, Bld: 110 mg/dL — ABNORMAL HIGH (ref 65–99)
POTASSIUM: 3.6 mmol/L (ref 3.5–5.3)
Sodium: 141 mmol/L (ref 135–146)

## 2015-08-19 NOTE — Addendum Note (Signed)
Addended by: Tonita PhoenixBOWDEN, Linea Calles K on: 08/19/2015 01:28 PM   Modules accepted: Orders

## 2015-08-20 ENCOUNTER — Telehealth: Payer: Self-pay | Admitting: *Deleted

## 2015-08-20 NOTE — Telephone Encounter (Signed)
Pt has been notified of lab results by phone with verbal understanding. 

## 2016-05-03 ENCOUNTER — Other Ambulatory Visit: Payer: Self-pay | Admitting: Cardiology

## 2016-05-03 MED ORDER — DIGOXIN 125 MCG PO TABS: 0.1250 mg | ORAL_TABLET | Freq: Every day | ORAL | 3 refills | Status: DC

## 2016-05-03 MED ORDER — DIGOXIN 125 MCG PO TABS: 0.1250 mg | ORAL_TABLET | Freq: Every day | ORAL | 0 refills | Status: DC

## 2016-05-03 MED ORDER — METOPROLOL SUCCINATE ER 50 MG PO TB24: ORAL_TABLET | ORAL | 3 refills | Status: DC

## 2016-05-03 MED ORDER — METOPROLOL SUCCINATE ER 100 MG PO TB24: ORAL_TABLET | ORAL | 0 refills | Status: DC

## 2016-05-03 MED ORDER — FUROSEMIDE 20 MG PO TABS: 20.0000 mg | ORAL_TABLET | Freq: Every day | ORAL | 0 refills | Status: DC

## 2016-05-03 NOTE — Addendum Note (Signed)
Addended by: Demetrios LollBARNARD, CATHY C on: 05/03/2016 11:08 AM   Modules accepted: Orders

## 2016-10-04 ENCOUNTER — Encounter: Payer: Self-pay | Admitting: Cardiology

## 2016-10-04 ENCOUNTER — Ambulatory Visit (INDEPENDENT_AMBULATORY_CARE_PROVIDER_SITE_OTHER): Payer: Managed Care, Other (non HMO) | Admitting: Cardiology

## 2016-10-04 VITALS — BP 120/64 | HR 91 | Ht 71.0 in | Wt 167.4 lb

## 2016-10-04 DIAGNOSIS — I481 Persistent atrial fibrillation: Secondary | ICD-10-CM | POA: Diagnosis not present

## 2016-10-04 DIAGNOSIS — I484 Atypical atrial flutter: Secondary | ICD-10-CM | POA: Diagnosis not present

## 2016-10-04 DIAGNOSIS — I420 Dilated cardiomyopathy: Secondary | ICD-10-CM | POA: Diagnosis not present

## 2016-10-04 DIAGNOSIS — G40109 Localization-related (focal) (partial) symptomatic epilepsy and epileptic syndromes with simple partial seizures, not intractable, without status epilepticus: Secondary | ICD-10-CM | POA: Insufficient documentation

## 2016-10-04 DIAGNOSIS — Z720 Tobacco use: Secondary | ICD-10-CM

## 2016-10-04 DIAGNOSIS — Q249 Congenital malformation of heart, unspecified: Secondary | ICD-10-CM

## 2016-10-04 DIAGNOSIS — F331 Major depressive disorder, recurrent, moderate: Secondary | ICD-10-CM | POA: Insufficient documentation

## 2016-10-04 DIAGNOSIS — I4819 Other persistent atrial fibrillation: Secondary | ICD-10-CM

## 2016-10-04 NOTE — Patient Instructions (Signed)
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
## 2016-11-07 DIAGNOSIS — I10 Essential (primary) hypertension: Secondary | ICD-10-CM | POA: Insufficient documentation

## 2016-11-14 ENCOUNTER — Ambulatory Visit: Payer: Self-pay | Admitting: Surgery

## 2018-12-19 ENCOUNTER — Other Ambulatory Visit: Payer: Self-pay

## 2018-12-19 ENCOUNTER — Emergency Department (HOSPITAL_COMMUNITY)
Admission: EM | Admit: 2018-12-19 | Discharge: 2018-12-20 | Discharge status: Against Medical Advice | Payer: Managed Care, Other (non HMO) | Attending: Emergency Medicine | Admitting: Emergency Medicine

## 2018-12-19 ENCOUNTER — Emergency Department (HOSPITAL_COMMUNITY): Payer: Managed Care, Other (non HMO)

## 2018-12-19 ENCOUNTER — Encounter (HOSPITAL_COMMUNITY): Payer: Self-pay | Admitting: *Deleted

## 2018-12-19 DIAGNOSIS — K429 Umbilical hernia without obstruction or gangrene: Secondary | ICD-10-CM | POA: Insufficient documentation

## 2018-12-19 DIAGNOSIS — R1031 Right lower quadrant pain: Secondary | ICD-10-CM | POA: Insufficient documentation

## 2018-12-19 DIAGNOSIS — I471 Supraventricular tachycardia: Secondary | ICD-10-CM | POA: Diagnosis not present

## 2018-12-19 DIAGNOSIS — F1290 Cannabis use, unspecified, uncomplicated: Secondary | ICD-10-CM | POA: Diagnosis not present

## 2018-12-19 DIAGNOSIS — Z5320 Procedure and treatment not carried out because of patient's decision for unspecified reasons: Secondary | ICD-10-CM | POA: Diagnosis not present

## 2018-12-19 DIAGNOSIS — R109 Unspecified abdominal pain: Secondary | ICD-10-CM

## 2018-12-19 DIAGNOSIS — F17210 Nicotine dependence, cigarettes, uncomplicated: Secondary | ICD-10-CM | POA: Diagnosis not present

## 2018-12-19 LAB — COMPREHENSIVE METABOLIC PANEL
ALT: 18 U/L (ref 0–44)
AST: 17 U/L (ref 15–41)
Albumin: 4.2 g/dL (ref 3.5–5.0)
Alkaline Phosphatase: 78 U/L (ref 38–126)
Anion gap: 11 (ref 5–15)
BUN: 9 mg/dL (ref 6–20)
CO2: 27 mmol/L (ref 22–32)
Calcium: 9.4 mg/dL (ref 8.9–10.3)
Chloride: 101 mmol/L (ref 98–111)
Creatinine, Ser: 1.12 mg/dL (ref 0.61–1.24)
GFR calc Af Amer: >60 mL/min (ref >60)
GFR calc non Af Amer: >60 mL/min (ref >60)
Glucose, Bld: 128 mg/dL — ABNORMAL HIGH (ref 70–99)
Potassium: 3.7 mmol/L (ref 3.5–5.1)
Sodium: 139 mmol/L (ref 135–145)
Total Bilirubin: 0.5 mg/dL (ref 0.3–1.2)
Total Protein: 7.1 g/dL (ref 6.5–8.1)

## 2018-12-19 LAB — CBC
HCT: 42.5 % (ref 39.0–52.0)
Hemoglobin: 14.4 g/dL (ref 13.0–17.0)
MCH: 30.6 pg (ref 26.0–34.0)
MCHC: 33.9 g/dL (ref 30.0–36.0)
MCV: 90.4 fL (ref 80.0–100.0)
Platelets: 174 K/uL (ref 150–400)
RBC: 4.7 MIL/uL (ref 4.22–5.81)
RDW: 14.1 % (ref 11.5–15.5)
WBC: 8.5 K/uL (ref 4.0–10.5)
nRBC: 0 % (ref 0.0–0.2)

## 2018-12-19 LAB — LIPASE, BLOOD: Lipase: 28 U/L (ref 11–51)

## 2018-12-19 MED ORDER — SODIUM CHLORIDE 0.9 % IV BOLUS
1000.0000 mL | Freq: Once | INTRAVENOUS | Status: AC
  Administered 2018-12-20: 1000 mL via INTRAVENOUS

## 2018-12-19 MED ORDER — MORPHINE SULFATE (PF) 4 MG/ML IV SOLN
4.0000 mg | Freq: Once | INTRAVENOUS | Status: AC
  Administered 2018-12-20: 4 mg via INTRAVENOUS
  Filled 2018-12-19: qty 1

## 2018-12-19 MED ORDER — ONDANSETRON HCL 4 MG/2ML IJ SOLN
4.0000 mg | Freq: Once | INTRAMUSCULAR | Status: AC
  Administered 2018-12-20: 4 mg via INTRAVENOUS
  Filled 2018-12-19: qty 2

## 2018-12-19 MED ORDER — SODIUM CHLORIDE 0.9% FLUSH
3.0000 mL | Freq: Once | INTRAVENOUS | Status: AC
  Administered 2018-12-19: 3 mL via INTRAVENOUS

## 2018-12-19 NOTE — ED Triage Notes (Signed)
Pt reports three days ago he started having pain in the right flank area. No difficulty urinating. Pt says it started right after he had a bowel movement that had bright red blood in it (hx of hemorrhoids). He has had 2 normal bowel movements since then. Nausea, no v/d, no fevers.

## 2018-12-19 NOTE — ED Provider Notes (Signed)
[facility] EMERGENCY [facility]MENT Provider Note   CSN: [id number] Arrival date & time: 12/19/18  2143    History   Chief Complaint Chief Complaint  Patient presents with  . Flank Pain    HPI Minda Meodward O Chain is a 43 y.o. male.     Patient presents with 3-day history of right-sided flank pain that radiates across his right abdomen.  Reports the pain is fairly constant.  He has been using marijuana for it at home without relief.  He came in tonight because the pain is worse.  States the pain started 3 days ago after he had a bowel movement and bright red blood with wiping.  He states he has a known history of hemorrhoids and believes that is what the issue was.  States the stool itself was not red or black.  He denies any fevers, chills, nausea or vomiting.  His bowel movements have since been normal and brown and nonbloody.  No vomiting but has had nausea.  No pain with urination or blood in the urine.  No bowel or bladder incontinence.  No radiation of the pain down the buttock or down the leg.  There is no focal weakness, numbness or tingling.  No chest pain or shortness of breath.  No history of kidney stones.  Patient with history of congenital heart disease and intermittent atrial tachycardia/atrial flutter.  He admits being off of his cardiac medications for several months which include metoprolol and Xarelto.   The history is provided by the patient.  Flank Pain  Associated symptoms include abdominal pain. Pertinent negatives include no headaches and no shortness of breath.    Past Medical History:  Diagnosis Date  . Dysrhythmia   . Seizures [facility](HCC)     Patient Active Problem List   Diagnosis Date Noted  . 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 Tobacco use 10/20/2014  . Congestive dilated cardiomyopathy (HCC) 10/20/2014  . Persistent atrial fibrillation 09/08/2014  . Congenital heart defect 09/08/2014  . Other hemorrhoids 09/08/2014    Past Surgical  History:  Procedure Laterality Date  . CARDIOVERSION N/A 12/24/2014   Procedure: CARDIOVERSION;  Surgeon: Jake BatheMark C Skains, MD;  [facility];  Service: Cardiovascular;  Laterality: N/A;  . pediatric bowel surgery    . pediatric heart surgery          Home Medications    Prior to Admission medications   Medication Sig Start Date End Date Taking? Authorizing Provider  digoxin (DIGOX) 0.125 MG tablet Take 1 tablet (0.125 mg total) by mouth daily. 05/03/16   Jake BatheSkains, Mark C, MD  divalproex (DEPAKOTE ER) 500 MG 24 hr tablet Take 500 mg by mouth daily.  02/28/15   [provider]  furosemide (LASIX) 20 MG tablet Take 1 tablet (20 mg total) by mouth daily. 05/03/16   Jake BatheSkains, Mark C, MD  gabapentin (NEURONTIN) 600 MG tablet Take 600 mg by mouth 2 (two) times daily.    [provider]  LATUDA 60 MG TABS Take 60 mg by mouth daily.  02/28/15   [provider]  metoprolol succinate (TOPROL-XL) 100 MG 24 hr tablet TAKE 1 TABLET (100 MG TOTAL)  BY MOUTH DAILY. TAKE WITH OR IMMEDIATELY FOLLOWING A MEAL. 05/03/16   Jake BatheSkains, Mark C, MD  metoprolol succinate (TOPROL-XL) 50 MG 24 hr tablet TAKE 1 TABLET BY MOUTH ONCE A DAY EVERY MORNING WITH OR IMMEDIATELY FOLLOWING A MEAL 05/03/16   Jake BatheSkains, Mark C, MD  XARELTO 20 MG TABS  tablet TAKE ONE TABLET BY MOUTH ONE TIME DAILY WITH SUPPER 07/05/15   Jake Bathe, MD    Family History Family History  Problem Relation Age of Onset  . Cancer Maternal Grandmother   . Heart attack Neg Hx   . Stroke Neg Hx     Social History Social History   Tobacco Use  . Smoking status: Current Every Day Smoker    Types: Cigarettes  . Smokeless tobacco: Never Used  Substance Use Topics  . Alcohol use: No  . Drug use: Yes    Types: Marijuana     Allergies   Aspirin; Nsaids; and Penicillins   Review of Systems Review of Systems  Constitutional: Negative for activity change and appetite change.  HENT: Negative for congestion.   Eyes:  Negative for visual disturbance.  Respiratory: Negative for cough, chest tightness and shortness of breath.   Gastrointestinal: Positive for abdominal pain, anal bleeding, blood in stool and nausea. Negative for vomiting.  Genitourinary: Positive for flank pain. Negative for decreased urine volume, difficulty urinating, dysuria, hematuria, testicular pain and urgency.  Musculoskeletal: Negative for arthralgias and myalgias.  Skin: Negative for rash.  Neurological: Negative for dizziness, weakness and headaches.    all other systems are negative except as noted in the HPI and PMH.    Physical Exam Updated Vital Signs BP 103/71   Pulse (!) 106   Temp 98 F (36.7 C) (Oral)   Resp 16   Ht  (1.803 m)   Wt 79.4 kg   SpO2 95%   BMI 24.41 kg/m   Physical Exam Vitals signs and nursing note reviewed.  Constitutional:      General: He is not in acute distress.    Appearance: He is well-developed.  HENT:     Head: Normocephalic and atraumatic.     Mouth/Throat:     Pharynx: No oropharyngeal exudate.  Eyes:     Conjunctiva/sclera: Conjunctivae normal.     Pupils: Pupils are equal, round, and reactive to light.  Neck:     Musculoskeletal: Normal range of motion and neck supple.     Comments: No meningismus. Cardiovascular:     Rate and Rhythm: Tachycardia present. Rhythm irregular.     Heart sounds: Normal heart sounds. No murmur.     Comments: Irregular tachycardia 110-120s Pulmonary:     Effort: Pulmonary effort is normal. No respiratory distress.     Breath sounds: Normal breath sounds.  Abdominal:     Palpations: Abdomen is soft.     Tenderness: There is no abdominal tenderness. There is no guarding or rebound.     Hernia: A hernia is present.     Comments: No distress, right-sided abdominal tenderness, no guarding or rebound. Reducible umbilical hernia  Genitourinary:    Comments: No testicular pain.  On exam there is a prolapsed internal hemorrhoid that is able to  be reduced.  There is no evidence of ongoing bleeding. Musculoskeletal: Normal range of motion.        General: Tenderness present.     Comments: Right CVA tenderness  5/5 strength in bilateral lower extremities. Ankle plantar and dorsiflexion intact. Great toe extension intact bilaterally. +2 DP and PT pulses. +2 patellar reflexes bilaterally. Normal gait.   Skin:    General: Skin is warm.     Capillary Refill: Capillary refill takes less than 2 seconds.  Neurological:     General: No focal deficit present.     Mental Status: He is alert  and oriented to person, place, and time. Mental status is at baseline.     Cranial Nerves: No cranial nerve deficit.     Motor: No abnormal muscle tone.     Coordination: Coordination normal.     Comments: No ataxia on finger to nose bilaterally. No pronator drift. 5/5 strength throughout. CN 2-12 intact.Equal grip strength. Sensation intact.   Psychiatric:        Behavior: Behavior normal.      ED Treatments / Results  Labs (all labs ordered are listed, but only abnormal results are displayed) Labs Reviewed  COMPREHENSIVE METABOLIC PANEL - Abnormal; Notable for the following components:      Result Value   Glucose, Bld 128 (*)    All other components within normal limits  URINALYSIS, ROUTINE W REFLEX MICROSCOPIC - Abnormal; Notable for the following components:   Color, Urine COLORLESS (*)    Specific Gravity, Urine 1.001 (*)    All other components within normal limits  LIPASE, BLOOD  CBC  POC OCCULT BLOOD, ED    EKG EKG Interpretation  Date/Time:  Friday Dec 20 2018 00:38:05 EDT Ventricular Rate:  211 PR Interval:    QRS Duration: 149 QT Interval:  276 QTC Calculation: 518 R Axis:   -96 Text Interpretation:  Extreme tachycardia with wide complex, no further rhythm analysis attempted SVT versus atrial flutter  Confirmed by Glynn Octave [phone]) on 12/20/2018 12:50:09 AM   Radiology Ct Renal Stone Study  Result Date:  12/20/2018 CLINICAL DATA:  Right flank pain EXAM: CT ABDOMEN AND PELVIS WITHOUT CONTRAST TECHNIQUE: Multidetector CT imaging of the abdomen and pelvis was performed following the standard protocol without IV contrast. COMPARISON:  None. FINDINGS: LOWER CHEST: Moderate cardiomegaly. HEPATOBILIARY: The hepatic contours and density are normal. There is no intra- or extrahepatic biliary dilatation. The gallbladder is contracted. PANCREAS: The pancreatic parenchymal contours are normal and there is no ductal dilatation. There is no peripancreatic fluid collection. SPLEEN: Normal. ADRENALS/URINARY TRACT: --Adrenal glands: Normal. --Right kidney/ureter: No hydronephrosis, nephroureterolithiasis, perinephric stranding or solid renal mass. --Left kidney/ureter: No hydronephrosis, nephroureterolithiasis, perinephric stranding or solid renal mass. --Urinary bladder: Normal for degree of distention STOMACH/BOWEL: --Stomach/Duodenum: There is no hiatal hernia or other gastric abnormality. The duodenal course and caliber are normal. --Small bowel: No dilatation or inflammation. --Colon: No focal abnormality. --Appendix: Normal. VASCULAR/LYMPHATIC: Normal course and caliber of the major abdominal vessels. No abdominal or pelvic lymphadenopathy. REPRODUCTIVE: Normal prostate size with symmetric seminal vesicles. MUSCULOSKELETAL. No bony spinal canal stenosis or focal osseous abnormality. OTHER: None. IMPRESSION: 1. No nephrolithiasis or obstructive uropathy. 2. Moderate cardiomegaly. Electronically Signed   By: Deatra Robinson M.D.   On: 12/20/2018 00:13    Procedures Procedures (including critical care time)  Medications Ordered in ED Medications  ondansetron (ZOFRAN) injection 4 mg (has no administration in time range)  morphine 4 MG/ML injection 4 mg (has no administration in time range)  sodium chloride 0.9 % bolus 1,000 mL (has no administration in time range)  sodium chloride flush (NS) 0.9 % injection 3 mL (3 mLs  Intravenous Given 12/19/18 2245)     Initial Impression / Assessment and Plan / ED Course  I have reviewed the triage vital signs and the nursing notes.  Pertinent labs & imaging results that were available during my care of the patient were reviewed by me and considered in my medical decision making (see chart for details).       3 days of right flank pain without urinary  symptoms.  No neurological deficits.  Reducible umbilical hernia on exam  Patient does have irregular tachycardia around 100-120 that elevates when he gets agitated. Admits to being off of his cardiac medications for several months. Will check EKG.  No evidence of cord compression or cauda equina.  Urinalysis is negative for infection or hematuria.  CT scan is negative without evidence of obstructive uropathy.  As patient's results are been discussed with him his heart rate is increased from the 1 teens to 120s to the 160s to 170s.  He became agitated.  Throughout his ED stay he developed rapid tachycardia rates from 170s to 220s. Appears that he is now in SVT with possible underlying atrial flutter. Patient became agitated when this was addressed with him and stated that he was not here for his heart did not want any work-up for his heart. States he is frustrated because we do not know what is causing his flank pain.  Discussed with patient that he may have passed a kidney stone.  He refuses to be given any medications for rate control and refuses cardioversion. He understands that this could be life-threatening and lead to lethal arrhythmias, heart attack, stroke or death.  Patient adamantly refuses any medication to control his heart rhythm. Patient appears to have capacity to make medical decisions and refuse care.  He is not suicidal or homicidal.  He is demanding that he be disconnected from the monitor and have his IV removed.  Patient walked out from the ED and refused any further treatment. It was discussed  with him that he is leaving his medical advice as his tachycardia could lead to life-threatening arrhythmia or cardiac arrest.   Patient requesting to leave against medical advice. He is alert and oriented x3 and appears to have decision making capacity. Denies suicidal or homicidal ideation.  Discussed that further testing is recommended and emergent medical conditions have not been ruled out. Discussed that leaving against medical advice may result in clinical deterioration and possible death.  Final Clinical Impressions(s) / ED Diagnoses   Final diagnoses:  Right flank pain  Atrial tachycardia [facility])    ED Discharge Orders    None       Glynn Octave, MD 12/20/18 [phone]

## 2018-12-20 ENCOUNTER — Telehealth: Payer: Self-pay | Admitting: *Deleted

## 2018-12-20 ENCOUNTER — Emergency Department (HOSPITAL_COMMUNITY): Payer: Managed Care, Other (non HMO)

## 2018-12-20 LAB — URINALYSIS, ROUTINE W REFLEX MICROSCOPIC
Bilirubin Urine: NEGATIVE
Glucose, UA: NEGATIVE mg/dL
Hgb urine dipstick: NEGATIVE
Ketones, ur: NEGATIVE mg/dL
Leukocytes,Ua: NEGATIVE
Nitrite: NEGATIVE
Protein, ur: NEGATIVE mg/dL
Specific Gravity, Urine: 1.001 — ABNORMAL LOW (ref 1.005–1.030)
pH: 7 (ref 5.0–8.0)

## 2018-12-20 LAB — POC OCCULT BLOOD, ED: Fecal Occult Bld: NEGATIVE

## 2018-12-20 MED ORDER — METOPROLOL SUCCINATE ER 100 MG PO TB24: ORAL_TABLET | ORAL | 0 refills | Status: DC

## 2018-12-20 MED ORDER — METOPROLOL SUCCINATE ER 50 MG PO TB24: ORAL_TABLET | ORAL | 3 refills | Status: DC

## 2018-12-20 MED ORDER — RIVAROXABAN 20 MG PO TABS: ORAL_TABLET | ORAL | 4 refills | Status: DC

## 2018-12-20 MED ORDER — ADENOSINE 6 MG/2ML IV SOLN
INTRAVENOUS | Status: AC
  Filled 2018-12-20: qty 6

## 2018-12-20 MED ORDER — DIGOXIN 125 MCG PO TABS: 0.1250 mg | ORAL_TABLET | Freq: Every day | ORAL | 0 refills | Status: DC

## 2018-12-20 MED ORDER — DILTIAZEM HCL-DEXTROSE 100-5 MG/100ML-% IV SOLN (PREMIX)
5.0000 mg/h | INTRAVENOUS | Status: DC
  Filled 2018-12-20: qty 100

## 2018-12-20 MED ORDER — DILTIAZEM LOAD VIA INFUSION
10.0000 mg | Freq: Once | INTRAVENOUS | Status: DC
  Filled 2018-12-20: qty 10

## 2018-12-20 NOTE — Telephone Encounter (Signed)
Attempted to contact pt.  Message came on stating that VM has not been set up.  Will try again later.

## 2018-12-20 NOTE — Telephone Encounter (Signed)
Spoke with pt and sent medications in to local pharmacy.  Per Dr. Anne Fu ok to schedule on his DOD day, Tuesday due to no availability on Monday.  Pt agreeable to appt.  Pt appreciative for call.

## 2018-12-20 NOTE — ED Notes (Signed)
Pt. Refusing for this tech to put the zoll pads on despite education on the dangers of having a heart rate in the 220's. Doctor and nurses witnessed encounter.

## 2018-12-20 NOTE — Telephone Encounter (Signed)
-----   Message from Jake Bathe, MD sent at 12/20/2018  6:56 AM EDT ----- Regarding: Try to contact him to get him meds. He left AMA from ER  HR was about 200. 1:1 atrial flutter.  Please call him and try to get him his Toprol, dig, Xarelto.  He's been off for some time.   Have him set up for Visit with DOD Monday if possible in office to get an ECG and see if meds are working.   Thanks.  Loraine Leriche

## 2018-12-20 NOTE — ED Notes (Signed)
Pt was very mad and angry with MD. He was asked to get treatment for heart rate and refused all treatment from any medical staff, Pt wanted IV out and wanted to leave. Pt was told it was very dangerous and asked to stay. He said get this IV out of me and let me the [facility] out of here. Dr Manus Gunning told him he needed medical attention again and PT. SAID NO. Pt got dressed and walked out.

## 2018-12-20 NOTE — Telephone Encounter (Signed)
Follow up: ° ° ° °Patient returning your call. Please call patient back. °

## 2018-12-24 ENCOUNTER — Ambulatory Visit (INDEPENDENT_AMBULATORY_CARE_PROVIDER_SITE_OTHER): Payer: Managed Care, Other (non HMO) | Admitting: Cardiology

## 2018-12-24 ENCOUNTER — Encounter: Payer: Self-pay | Admitting: Cardiology

## 2018-12-24 ENCOUNTER — Other Ambulatory Visit: Payer: Self-pay

## 2018-12-24 VITALS — BP 124/66 | HR 110 | Ht 71.0 in | Wt 155.0 lb

## 2018-12-24 DIAGNOSIS — I420 Dilated cardiomyopathy: Secondary | ICD-10-CM | POA: Diagnosis not present

## 2018-12-24 DIAGNOSIS — I484 Atypical atrial flutter: Secondary | ICD-10-CM | POA: Diagnosis not present

## 2018-12-24 NOTE — Patient Instructions (Signed)
Medication Instructions:  The current medical regimen is effective;  continue present plan and medications.  May change Metoprolol succinate to Metoprolol Tartrate 100 mg every morning and Metoprolol tartrate 50 mg every evening.  This medication is listed as $10 for 180 tablets at [facility].  If you need a refill on your cardiac medications before your next appointment, please call your pharmacy.   Follow-Up: Follow up in 1 year with Dr. Anne Fu.  You will receive a letter in the mail 2 months before you are due.  Please call us when you receive this letter to schedule your follow up appointment.  Thank you for choosing [facility]!!

## 2018-12-24 NOTE — Progress Notes (Signed)
Cardiology Office Note:    Date:  12/25/2018   ID:  Cole Reynolds, DOB May 20, 1976, MRN [id number]  PCP:  Elfredia Nevins, MD  Cardiologist:  Donato Schultz, MD  Electrophysiologist:  None   Referring MD: Elfredia Nevins, MD   Here for follow-up of tachycardia  History of Present Illness:    Cole Reynolds is a 43 y.o. male with atrial flutter/atrial tachycardia, congenital heart defect, with recent ER visit, demonstrating significant tachycardia 210 bpm.  One-to-one atrial flutter. He had not been taking his medications digoxin and Toprol.   Most recent office visit on 10/04/2016 showed atrial flutter with variable conduction.  His EKG today shows sinus tachycardia 110 with right bundle branch block left anterior fascicular block bifascicular block, now having 2-1 conduction of his atrial flutter underlying.  Personally reviewed.  He got upset after the EKG was done and left the building.  He stated that he does not know why people keep doing EKGs on him.  He told Malcolm Metro, CMA that he was feeling better.  No syncope.  Past Medical History:  Diagnosis Date  . Dysrhythmia   . Seizures (HCC)     Past Surgical History:  Procedure Laterality Date  . CARDIOVERSION N/A 12/24/2014   Procedure: CARDIOVERSION;  Surgeon: Jake Bathe, MD;  Location[facility] ENDOSCOPY;  Service: Cardiovascular;  Laterality: N/A;  . pediatric bowel surgery    . pediatric heart surgery      Current Medications: Current Meds  Medication Sig  . digoxin (DIGOX) 0.125 MG tablet Take 1 tablet (0.125 mg total) by mouth daily.  Marland Kitchen gabapentin (NEURONTIN) 600 MG tablet Take 600 mg by mouth 2 (two) times daily.  . metoprolol succinate (TOPROL-XL) 100 MG 24 hr tablet TAKE 1 TABLET (100 MG TOTAL)  BY MOUTH DAILY EVERY EVENING. TAKE WITH OR IMMEDIATELY FOLLOWING A MEAL.  . metoprolol succinate (TOPROL-XL) 50 MG 24 hr tablet TAKE 1 TABLET BY MOUTH ONCE A DAY EVERY MORNING WITH OR IMMEDIATELY FOLLOWING A MEAL  .  sertraline (ZOLOFT) 100 MG tablet Take 100 mg by mouth daily.     Allergies:   Aspirin; Nsaids; and Penicillins   Social History   Socioeconomic History  . Marital status: Married    Spouse name: Not on file  . Number of children: Not on file  . Years of education: Not on file  . Highest education level: Not on file  Occupational History  . Not on file  Social Needs  . Financial resource strain: Not on file  . Food insecurity:    Worry: Not on file    Inability: Not on file  . Transportation needs:    Medical: Not on file    Non-medical: Not on file  Tobacco Use  . Smoking status: Current Every Day Smoker    Types: Cigarettes  . Smokeless tobacco: Never Used  Substance and Sexual Activity  . Alcohol use: No  . Drug use: Yes    Types: Marijuana  . Sexual activity: Not on file  Lifestyle  . Physical activity:    Days per week: Not on file    Minutes per session: Not on file  . Stress: Not on file  Relationships  . Social connections:    Talks on phone: Not on file    Gets together: Not on file    Attends religious service: Not on file    Active member of club or organization: Not on file    Attends meetings of clubs  or organizations: Not on file    Relationship status: Not on file  Other Topics Concern  . Not on file  Social History Narrative  . Not on file     Family History: The patient's family history includes Cancer in his maternal grandmother. There is no history of Heart attack or Stroke.  ROS:   Please see the history of present illness.     All other systems reviewed and are negative.  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   The ekg ordered today demonstrates atrial flutter with 2-1 conduction heart rate 110 bpm bifascicular block as described above.  Markedly improved from one-to-one conduction at 207 bpm  Recent Labs: 12/19/2018: ALT 18; BUN 9; Creatinine, Ser 1.12; Hemoglobin 14.4; Platelets 174; Potassium 3.7; Sodium 139  Recent Lipid Panel No results found for: CHOL, TRIG, HDL, CHOLHDL, VLDL, LDLCALC, LDLDIRECT  Physical Exam:    VS:  BP 124/66   Pulse (!) 110   Ht  (1.803 m)   Wt 155 lb (70.3 kg)   SpO2 97%   BMI 21.62 kg/m     Wt Readings from Last 3 Encounters:  12/24/18 155 lb (70.3 kg)  12/19/18 175 lb (79.4 kg)  10/04/16 167 lb 6.4 oz (75.9 kg)     GEN:  Well nourished, well developed in no acute distress MUSCULOSKELETAL:  No edema; No deformity  SKIN: Warm and dry NEUROLOGIC:  Alert and oriented x 3 PSYCHIATRIC:  Upset  ASSESSMENT:    1. Atypical atrial flutter (HCC)   2. Dilated cardiomyopathy (HCC)    PLAN:    In order of problems listed above:  Dilated cardiomyopathy - Thought to be in part tachycardia induced.  Has been as low as 15%.  Had increased to 40% when heart rate was controlled. - He stated that he could not afford the metoprolol XL and he was cutting the 100 tablets in half and taking 100 in the morning and 50 in the evening.  Total of 150 mg a day.  Before we have a chance to talk about an alternative such as metoprolol tartrate 100/50, he left the building upset.  To reduce cost, $4 [employer] plan for instance, we will attempt to change him to metoprolol tartrate 100 mg in the morning and metoprolol tartrate 50 mg in the evening.  Obviously, this medication seems to be helping him control his heart rate better.  We will try to contact him.  Atrial fibrillation/flutter - Had a cardioversion in 2016 but reverted back to fibrillation/flutter.  In the ER heart rate was 1 :1 flutter.  He was asked to restart his Toprol and digoxin.  The metoprolol seem to be helping.   Digoxin was also reinitiated.    Medication Adjustments/Labs and Tests Ordered: Current medicines are reviewed at length with the patient today.  Concerns regarding medicines are outlined above.  Orders Placed This Encounter  Procedures  . EKG 12-Lead   No orders of the defined types were placed in this encounter.   Patient Instructions  Medication Instructions:  The current medical regimen is effective;  continue present plan and medications.  May change Metoprolol succinate to Metoprolol Tartrate 100 mg  every morning and Metoprolol tartrate 50 mg every evening.  This medication is listed as $10 for 180 tablets at [facility].  If you need a refill on your cardiac medications before your next appointment, please call your pharmacy.   Follow-Up: Follow up in 1 year with Dr. Anne FuSkains.  You will receive a letter in the mail 2 months before you are due.  Please call us when you receive this letter to schedule your follow up appointment.  Thank you for choosing [facility]!!         Signed, Donato SchultzMark Haward Pope, MD  12/25/2018 8:59 AM    [facility]

## 2018-12-25 ENCOUNTER — Encounter: Payer: Self-pay | Admitting: Cardiology

## 2019-01-26 ENCOUNTER — Other Ambulatory Visit: Payer: Self-pay | Admitting: Cardiology

## 2019-04-22 ENCOUNTER — Other Ambulatory Visit: Payer: Self-pay | Admitting: Cardiology

## 2019-06-10 ENCOUNTER — Other Ambulatory Visit: Payer: Self-pay | Admitting: Cardiology

## 2019-07-11 ENCOUNTER — Other Ambulatory Visit: Payer: Self-pay | Admitting: Cardiology

## 2019-09-28 ENCOUNTER — Other Ambulatory Visit: Payer: Self-pay

## 2019-09-28 ENCOUNTER — Emergency Department (HOSPITAL_COMMUNITY)
Admission: EM | Admit: 2019-09-28 | Discharge: 2019-09-28 | Discharge status: Against Medical Advice | Payer: Managed Care, Other (non HMO) | Attending: Emergency Medicine | Admitting: Emergency Medicine

## 2019-09-28 ENCOUNTER — Emergency Department (HOSPITAL_COMMUNITY): Payer: Managed Care, Other (non HMO)

## 2019-09-28 DIAGNOSIS — R000 Tachycardia, unspecified: Secondary | ICD-10-CM

## 2019-09-28 DIAGNOSIS — K625 Hemorrhage of anus and rectum: Secondary | ICD-10-CM | POA: Diagnosis not present

## 2019-09-28 DIAGNOSIS — F17210 Nicotine dependence, cigarettes, uncomplicated: Secondary | ICD-10-CM | POA: Insufficient documentation

## 2019-09-28 DIAGNOSIS — R1084 Generalized abdominal pain: Secondary | ICD-10-CM | POA: Insufficient documentation

## 2019-09-28 DIAGNOSIS — Z79899 Other long term (current) drug therapy: Secondary | ICD-10-CM | POA: Insufficient documentation

## 2019-09-28 DIAGNOSIS — R002 Palpitations: Secondary | ICD-10-CM | POA: Diagnosis not present

## 2019-09-28 LAB — CBC
HCT: 43.8 % (ref 39.0–52.0)
Hemoglobin: 14.8 g/dL (ref 13.0–17.0)
MCH: 30.5 pg (ref 26.0–34.0)
MCHC: 33.8 g/dL (ref 30.0–36.0)
MCV: 90.3 fL (ref 80.0–100.0)
Platelets: 211 10*3/uL (ref 150–400)
RBC: 4.85 MIL/uL (ref 4.22–5.81)
RDW: 13.9 % (ref 11.5–15.5)
WBC: 9.9 10*3/uL (ref 4.0–10.5)
nRBC: 0 % (ref 0.0–0.2)

## 2019-09-28 LAB — URINALYSIS, ROUTINE W REFLEX MICROSCOPIC
Bilirubin Urine: NEGATIVE
Glucose, UA: NEGATIVE mg/dL
Hgb urine dipstick: NEGATIVE
Ketones, ur: NEGATIVE mg/dL
Leukocytes,Ua: NEGATIVE
Nitrite: NEGATIVE
Protein, ur: NEGATIVE mg/dL
Specific Gravity, Urine: 1.005 (ref 1.005–1.030)
pH: 6 (ref 5.0–8.0)

## 2019-09-28 LAB — COMPREHENSIVE METABOLIC PANEL
ALT: 22 U/L (ref 0–44)
AST: 26 U/L (ref 15–41)
Albumin: 4.2 g/dL (ref 3.5–5.0)
Alkaline Phosphatase: 71 U/L (ref 38–126)
Anion gap: 12 (ref 5–15)
BUN: 6 mg/dL (ref 6–20)
CO2: 25 mmol/L (ref 22–32)
Calcium: 9.3 mg/dL (ref 8.9–10.3)
Chloride: 101 mmol/L (ref 98–111)
Creatinine, Ser: 1.12 mg/dL (ref 0.61–1.24)
GFR calc Af Amer: >60 mL/min (ref >60)
GFR calc non Af Amer: >60 mL/min (ref >60)
Glucose, Bld: 128 mg/dL — ABNORMAL HIGH (ref 70–99)
Potassium: 3.1 mmol/L — ABNORMAL LOW (ref 3.5–5.1)
Sodium: 138 mmol/L (ref 135–145)
Total Bilirubin: 0.8 mg/dL (ref 0.3–1.2)
Total Protein: 6.9 g/dL (ref 6.5–8.1)

## 2019-09-28 LAB — TYPE AND SCREEN
ABO/RH(D): O POS
Antibody Screen: NEGATIVE

## 2019-09-28 LAB — BRAIN NATRIURETIC PEPTIDE: B Natriuretic Peptide: 55.9 pg/mL (ref 0.0–100.0)

## 2019-09-28 LAB — LIPASE, BLOOD: Lipase: 22 U/L (ref 11–51)

## 2019-09-28 LAB — DIGOXIN LEVEL: Digoxin Level: <0.2 ng/mL — ABNORMAL LOW (ref 0.8–2.0)

## 2019-09-28 LAB — ABO/RH: ABO/RH(D): O POS

## 2019-09-28 MED ORDER — METOPROLOL SUCCINATE ER 25 MG PO TB24
50.0000 mg | ORAL_TABLET | Freq: Once | ORAL | Status: AC
  Administered 2019-09-28: 50 mg via ORAL
  Filled 2019-09-28: qty 2

## 2019-09-28 MED ORDER — SODIUM CHLORIDE 0.9 % IV SOLN: INTRAVENOUS | Status: DC

## 2019-09-28 MED ORDER — METOPROLOL TARTRATE 5 MG/5ML IV SOLN: 5.0000 mg | INTRAVENOUS | Status: DC | PRN

## 2019-09-28 MED ORDER — METOPROLOL TARTRATE 5 MG/5ML IV SOLN
5.0000 mg | INTRAVENOUS | Status: DC | PRN
  Administered 2019-09-28: 5 mg via INTRAVENOUS
  Filled 2019-09-28 (×2): qty 5

## 2019-09-28 MED ORDER — IOHEXOL 300 MG/ML  SOLN
100.0000 mL | Freq: Once | INTRAMUSCULAR | Status: AC | PRN
  Administered 2019-09-28: 100 mL via INTRAVENOUS

## 2019-09-28 NOTE — ED Notes (Signed)
Pt called this RN into room and said, "I need to go, I need to go right now." Asked pt what was wrong and he just said, "I have to leave right now" and began ripping leads off. MD notified, pt left AMA. IV removed, risks of leaving AMA explained to pt and pt verbalized understanding.

## 2019-09-28 NOTE — ED Notes (Signed)
Patient transported to CT 

## 2019-09-28 NOTE — ED Notes (Signed)
Pt asked to provide a urine sample. Urinal at bedside.

## 2019-09-28 NOTE — ED Triage Notes (Signed)
Patient here for multiple complaints. Patient states he has multiple hernias and is having abdominal pain along with hemorrhoid bleeding. Patient also having left sided pain since moving a refrigerator on Thursday.

## 2019-09-28 NOTE — ED Provider Notes (Signed)
[facility] EMERGENCY DEPARTMENT Provider Note   CSN: [id number] Arrival date & time: 09/28/19  1206     History Chief Complaint  Patient presents with  . Abdominal Pain  . Tachycardia    Cole Reynolds is a 44 y.o. male.  Patient with a known history of kind of a tacky arrhythmia followed by cardiology.  Apparently fairly well controlled on meds when he is compliant.  Patient supposed to be taking Toprol XL 100 mg in the evening and 50 mg in the morning.  Remain he is on Lanoxin 0.125 mg daily.  But he is going through divorce and got separated from his medications about 2 days ago he states.  But he mostly came in for abdominal pain which is all over abdomen distended he is worried about a hernia with pains mostly left-sided however.  No nausea no vomiting.  Patient's heart rate was highly variable.  Sort of follows chart review shows that when he gets excited it tends to go up and it tends to come down.  The rhythm very consistent with what he is had in the past.  Patient denied any chest pain any shortness of breath.  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  Known to have congestive dilated cardiomyopathy as an adult.  Cardiology thinks that he probably had some sort of septal abnormality.  Patient also had bowel obstruction and surgery he has inverted abdominal contents.  We are everything that is on the left side is on the right side for him.  In addition patient states that he has had some blood from his rectum without any stool.  Michela Pitcher he has had a history of bleeding hemorrhoids.        Past Medical History:  Diagnosis Date  . Dysrhythmia   . Seizures [facility])     Patient Active Problem List   Diagnosis Date Noted  . 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 Tobacco use 10/20/2014  . Congestive dilated cardiomyopathy ([place]) 10/20/2014  . Persistent atrial fibrillation ([place]) 09/08/2014  . Congenital heart  defect 09/08/2014  . Other hemorrhoids 09/08/2014    Past Surgical History:  Procedure Laterality Date  . CARDIOVERSION N/A 12/24/2014   Procedure: CARDIOVERSION;  Surgeon: Jerline Pain, MD;  Location: [facility] ENDOSCOPY;  Service: Cardiovascular;  Laterality: N/A;  . pediatric bowel surgery    . pediatric heart surgery         Family History  Problem Relation Age of Onset  . Cancer Maternal Grandmother   . Heart attack Neg Hx   . Stroke Neg Hx     Social History   Tobacco Use  . Smoking status: Current Every Day Smoker    Types: Cigarettes  . Smokeless tobacco: Never Used  Substance Use Topics  . Alcohol use: No  . Drug use: Yes    Types: Marijuana    Home Medications Prior to Admission medications   Medication Sig Start Date End Date Taking? Authorizing Provider  digoxin (LANOXIN) 0.125 MG tablet Take 1 tablet by mouth once daily Patient taking differently: Take 0.125 mg by mouth daily.  04/23/19  Yes Jerline Pain, MD  gabapentin (NEURONTIN) 600 MG tablet Take 600 mg by mouth 2 (two) times daily.   Yes [provider]  metoprolol succinate (TOPROL-XL) 100 MG 24 hr tablet TAKE 1 TABLET BY MOUTH ONCE DAILY IN THE EVENING WITH  OR  IMMEDIATELY  FOLLOWING  A  MEAL Patient taking differently: Take 100 mg by mouth daily.  06/10/19  Yes Jake Bathe, MD  metoprolol succinate (TOPROL-XL) 50 MG 24 hr tablet TAKE 1 TABLET BY MOUTH ONCE DAILY IN THE MORNING WITH OR IMMEDIATELY FOLLOWING A MEAL Patient not taking: Reported on 09/28/2019 07/11/19   Jake Bathe, MD  sertraline (ZOLOFT) 100 MG tablet Take 100 mg by mouth daily. 06/29/18   [provider]    Allergies    Aspirin, Nsaids, and Penicillins  Review of Systems   Review of Systems  Constitutional: Negative for chills and fever.  HENT: Negative for congestion, rhinorrhea and sore throat.   Eyes: Negative for visual disturbance.  Respiratory: Negative for cough and shortness of breath.     Cardiovascular: Positive for palpitations. Negative for chest pain and leg swelling.  Gastrointestinal: Positive for abdominal pain and anal bleeding. Negative for diarrhea, nausea and vomiting.  Genitourinary: Negative for dysuria.  Musculoskeletal: Negative for back pain and neck pain.  Skin: Negative for rash.  Neurological: Negative for dizziness, light-headedness and headaches.  Hematological: Does not bruise/bleed easily.  Psychiatric/Behavioral: Negative for confusion.    Physical Exam Updated Vital Signs BP (!) 136/95   Pulse (!) 106   Temp 97.7 F (36.5 C) (Oral)   Resp 15   Ht 1.803 m (5\' 11" )   Wt 83.9 kg   SpO2 94%   BMI 25.80 kg/m   Physical Exam Vitals and nursing note reviewed.  Constitutional:      Appearance: Normal appearance. He is well-developed.  HENT:     Head: Normocephalic and atraumatic.  Eyes:     Extraocular Movements: Extraocular movements intact.     Conjunctiva/sclera: Conjunctivae normal.     Pupils: Pupils are equal, round, and reactive to light.  Cardiovascular:     Rate and Rhythm: Tachycardia present. Rhythm irregular.     Heart sounds: Murmur present.  Pulmonary:     Effort: Pulmonary effort is normal. No respiratory distress.     Breath sounds: Normal breath sounds.  Abdominal:     General: There is distension.     Palpations: Abdomen is soft.     Tenderness: There is no abdominal tenderness.     Comments: Reducible umbilical hernia nontender  Musculoskeletal:        General: Normal range of motion.     Cervical back: Neck supple.  Skin:    General: Skin is warm and dry.     Capillary Refill: Capillary refill takes less than 2 seconds.  Neurological:     General: No focal deficit present.     Mental Status: He is alert and oriented to person, place, and time.     ED Results / Procedures / Treatments   Labs (all labs ordered are listed, but only abnormal results are displayed) Labs Reviewed  COMPREHENSIVE METABOLIC  PANEL - Abnormal; Notable for the following components:      Result Value   Potassium 3.1 (*)    Glucose, Bld 128 (*)    All other components within normal limits  DIGOXIN LEVEL - Abnormal; Notable for the following components:   Digoxin Level <0.2 (*)    All other components within normal limits  LIPASE, BLOOD  CBC  URINALYSIS, ROUTINE W REFLEX MICROSCOPIC  BRAIN NATRIURETIC PEPTIDE  TYPE AND SCREEN  ABO/RH    EKG EKG Interpretation  Date/Time:  Sunday September 28 2019 12:19:54 EST Ventricular Rate:  123 PR Interval:    QRS Duration: 151 QT  Interval:  334 QTC Calculation: 478 R Axis:   -84 Text Interpretation: Sinus tachycardia Supraventricular bigeminy RBBB and LAFB Otherwise no significant change Confirmed by Vanetta Mulders [phone]) on 09/28/2019 12:31:00 PM   Radiology CT Abdomen Pelvis W Contrast  Result Date: 09/28/2019 CLINICAL DATA:  Abdominal pain, bleeding hemorrhoids. EXAM: CT ABDOMEN AND PELVIS WITH CONTRAST TECHNIQUE: Multidetector CT imaging of the abdomen and pelvis was performed using the standard protocol following bolus administration of intravenous contrast. CONTRAST:  OMNIPAQUE IOHEXOL 300 MG/ML  SOLN COMPARISON:  CT abdomen pelvis dated 12/19/2018 FINDINGS: Lower chest: No acute abnormality. Hepatobiliary: No focal liver abnormality is seen. No gallstones, gallbladder wall thickening, or biliary dilatation. Pancreas: Unremarkable. No pancreatic ductal dilatation or surrounding inflammatory changes. Spleen: Normal in size without focal abnormality. Adrenals/Urinary Tract: Adrenal glands are unremarkable. Other than left renal cysts measuring up to 1.5 cm, the kidneys are normal, without renal calculi, focal lesion, or hydronephrosis. Bladder is unremarkable. Stomach/Bowel: Stomach is within normal limits. The large bowel is on the left side of abdomen, suggestive of congenital malrotation. No pericecal inflammatory changes are noted to suggest acute  appendicitis. No evidence of bowel wall thickening, distention, or inflammatory changes. Vascular/Lymphatic: Aortic atherosclerosis. No enlarged abdominal or pelvic lymph nodes. Reproductive: Prostate is unremarkable. Other: No abdominal wall hernia or abnormality. No abdominopelvic ascites. Musculoskeletal: Degenerative changes are most significant at L5-S1. IMPRESSION: 1. No acute process in the abdomen or pelvis. Aortic Atherosclerosis (ICD10-I70.0). Electronically Signed   By: Romona Curls M.D.   On: 09/28/2019 14:00   DG Chest Port 1 View  Result Date: 09/28/2019 CLINICAL DATA:  44 year old male with history of palpitations. EXAM: PORTABLE CHEST 1 VIEW COMPARISON:  Chest x-ray 09/02/2014. FINDINGS: Lung volumes are normal. No consolidative airspace disease. No pleural effusions. No pneumothorax. No pulmonary nodule or mass noted. Pulmonary vasculature and the cardiomediastinal silhouette are within normal limits. Tiny median sternotomy wires suggesting pediatric cardiac surgery. IMPRESSION: No radiographic evidence of acute cardiopulmonary disease. Electronically Signed   By: Trudie Reed M.D.   On: 09/28/2019 13:01    Procedures Procedures (including critical care time)  Medications Ordered in ED Medications  0.9 %  sodium chloride infusion ( Intravenous Stopped 09/28/19 1641)  metoprolol tartrate (LOPRESSOR) injection 5 mg (5 mg Intravenous Given 09/28/19 1310)  iohexol (OMNIPAQUE) 300 MG/ML solution 100 mL (100 mLs Intravenous Contrast Given 09/28/19 1325)  metoprolol succinate (TOPROL-XL) 24 hr tablet 50 mg (50 mg Oral Given 09/28/19 1423)    ED Course  I have reviewed the triage vital signs and the nursing notes.  Pertinent labs & imaging results that were available during my care of the patient were reviewed by me and considered in my medical decision making (see chart for details).    MDM Rules/Calculators/A&P                     Patient's heart rate originally up as high as  166.  Given IV Lopressor 5 mg.  Brought his heart rate down pretty nicely to the low 100s.  And then he was given 50 mg of Toprol XL.  And was planning to observe.  Had abdominal distention but no significant tenderness CT scan of abdomen showed no acute abnormalities.  Patient's labs very normal.  Was planning and observation.  Patient talked about bleeding hemorrhoids hemoglobin hematocrit was fine.  Trace blood in his bowel movements.  Was planning to get back to do rectal exam to examine the hemorrhoids.  And  patient left AMA.  He has done this with his last ED visit as well as his last cardiology visit.   Final Clinical Impression(s) / ED Diagnoses Final diagnoses:  Generalized abdominal pain  Palpitations  Tachycardia    Rx / DC Orders ED Discharge Orders    None       Vanetta Mulders, MD 09/28/19 [phone]

## 2019-09-28 NOTE — ED Notes (Signed)
ED Provider at bedside. 

## 2019-10-16 ENCOUNTER — Ambulatory Visit (INDEPENDENT_AMBULATORY_CARE_PROVIDER_SITE_OTHER): Payer: Managed Care, Other (non HMO) | Admitting: Family Medicine

## 2019-10-16 ENCOUNTER — Other Ambulatory Visit: Payer: Self-pay

## 2019-10-16 ENCOUNTER — Encounter: Payer: Self-pay | Admitting: Family Medicine

## 2019-10-16 VITALS — BP 111/71 | HR 75 | Temp 99.1°F | Ht 71.0 in | Wt 159.8 lb

## 2019-10-16 DIAGNOSIS — Z23 Encounter for immunization: Secondary | ICD-10-CM

## 2019-10-16 DIAGNOSIS — L739 Follicular disorder, unspecified: Secondary | ICD-10-CM | POA: Diagnosis not present

## 2019-10-16 MED ORDER — TETANUS-DIPHTH-ACELL PERTUSSIS 5-2.5-18.5 LF-MCG/0.5 IM SUSP: 0.5000 mL | Freq: Once | INTRAMUSCULAR | 0 refills | Status: AC

## 2019-10-16 MED ORDER — CEPHALEXIN 500 MG PO CAPS: 500.0000 mg | ORAL_CAPSULE | Freq: Two times a day (BID) | ORAL | 0 refills | Status: AC

## 2019-10-16 MED ORDER — CLOTRIMAZOLE 1 % EX CREA: 1.0000 "application " | TOPICAL_CREAM | Freq: Two times a day (BID) | CUTANEOUS | 0 refills | Status: AC

## 2019-10-16 NOTE — Progress Notes (Signed)
New Patient Office Visit  Assessment & Plan:  1. Folliculitis - cephALEXin (KEFLEX) 500 MG capsule; Take 1 capsule (500 mg total) by mouth 2 (two) times daily for 7 days.  Dispense: 14 capsule; Refill: 0  2. Immunization due - Tdap (BOOSTRIX) 5-2.5-18.5 LF-MCG/0.5 injection; Inject 0.5 mLs into the muscle once for 1 dose.  Dispense: 0.5 mL; Refill: 0   Follow-up: Return in about 6 months (around 04/17/2020) for annual physical.   Cole Limes, MSN, APRN, FNP-C [facility]  Subjective:  Patient ID: DEGAN HANSER, male    DOB: February 02, 1976  Age: 44 y.o. MRN: [id number]  Patient Care Team: Loman Brooklyn, FNP as PCP - General (Family Medicine) Val Riles, PA-C as Consulting Physician (Neurology) [place] Crane, MD as Consulting Physician (Psychiatry) Jerline Pain, MD as Consulting Physician (Cardiology)  CC:  Chief Complaint  Patient presents with  . New Patient (Initial Visit)    Personal complaint   . Establish Care    [place] pt    HPI Cole Reynolds presents to establish care. He is transferring care from Dr. Murrell Redden office as he has retired and the office has closed.   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 Hassell Done, neurologist.  Atrial flutter and dilated cardiomyopathy are managed by Dr. Marlou Porch, cardiologist.  Depression is managed by Dr. Toy Care, psychiatrist.   The only concern patient has today of his scrotum.  He reports he gets white bumps that has black stuff that comes out of it.  Reports a foul odor of the what comes out.  Also states sometimes it is erythematous.  Review of Systems  Constitutional: Negative for chills, fever, malaise/fatigue and weight loss.  HENT: Negative for congestion, ear discharge, ear pain, nosebleeds, sinus pain, sore throat and tinnitus.   Eyes: Negative for blurred vision, double vision, pain, discharge and redness.  Respiratory: Negative for cough, shortness of breath and wheezing.     Cardiovascular: Negative for chest pain and leg swelling.  Gastrointestinal: Negative for abdominal pain, constipation, diarrhea, heartburn, nausea and vomiting.  Genitourinary: Negative for dysuria, frequency and urgency.  Musculoskeletal: Negative for myalgias.  Skin: Negative for rash.  Neurological: Negative for dizziness, seizures and weakness.  Psychiatric/Behavioral: Positive for depression. Negative for substance abuse and suicidal ideas. The patient is not nervous/anxious.     Current Outpatient Medications:  .  digoxin (LANOXIN) 0.125 MG tablet, Take 1 tablet by mouth once daily (Patient taking differently: Take 0.125 mg by mouth daily. ), Disp: 90 tablet, Rfl: 2 .  gabapentin (NEURONTIN) 300 MG capsule, Take 2 capsules by mouth 2 (two) times daily., Disp: , Rfl:  .  metoprolol succinate (TOPROL-XL) 100 MG 24 hr tablet, TAKE 1 TABLET BY MOUTH ONCE DAILY IN THE EVENING WITH  OR  IMMEDIATELY  FOLLOWING  A  MEAL (Patient taking differently: Take 100 mg by mouth daily. ), Disp: 90 tablet, Rfl: 2 .  metoprolol succinate (TOPROL-XL) 50 MG 24 hr tablet, TAKE 1 TABLET BY MOUTH ONCE DAILY IN THE MORNING WITH OR IMMEDIATELY FOLLOWING A MEAL, Disp: 30 tablet, Rfl: 5 .  sertraline (ZOLOFT) 100 MG tablet, Take 100 mg by mouth daily., Disp: , Rfl:  .  cephALEXin (KEFLEX) 500 MG capsule, Take 1 capsule (500 mg total) by mouth 2 (two) times daily for 7 days., Disp: 14 capsule, Rfl: 0 .  clotrimazole (LOTRIMIN) 1 % cream, Apply 1 application topically 2 (two) times daily for 14 days., Disp: 45 g, Rfl:  0  Allergies  Allergen Reactions  . Aspirin Other (See Comments)    unknown  . Nsaids Hives  . Penicillins Other (See Comments)    unknown    Past Medical History:  Diagnosis Date  . Depression   . Dysrhythmia   . Hypertension   . IBS (irritable bowel syndrome)   . Migraine   . Partial epilepsy ([facility])   . Seizures ([facility])   . Third degree hemorrhoids   . Umbilical hernia     Past  Surgical History:  Procedure Laterality Date  . CARDIOVERSION N/A 12/24/2014   Procedure: CARDIOVERSION;  Surgeon: Jake Bathe, MD;  Location: [facility] ENDOSCOPY;  Service: Cardiovascular;  Laterality: N/A;  . pediatric bowel surgery    . pediatric heart surgery      Family History  Problem Relation Age of Onset  . Lung cancer Maternal Grandmother   . Heart attack Neg Hx   . Stroke Neg Hx     Social History   Socioeconomic History  . Marital status: Married    Spouse name: Not on file  . Number of children: Not on file  . Years of education: Not on file  . Highest education level: Not on file  Occupational History  . Not on file  Tobacco Use  . Smoking status: Current Every Day Smoker    Packs/day: 1.00    Types: Cigarettes  . Smokeless tobacco: Never Used  Substance and Sexual Activity  . Alcohol use: No  . Drug use: Yes    Types: Marijuana    Comment: 10/16/19-quit x 3 weeks ago  . Sexual activity: Not Currently  Other Topics Concern  . Not on file  Social History Narrative  . Not on file   Social Determinants of Health   Financial Resource Strain:   . Difficulty of Paying Living Expenses:   Food Insecurity:   . Worried About Programme researcher, broadcasting/film/video in the Last Year:   . Barista in the Last Year:   Transportation Needs:   . Freight forwarder (Medical):   Marland Kitchen Lack of Transportation (Non-Medical):   Physical Activity:   . Days of Exercise per Week:   . Minutes of Exercise per Session:   Stress:   . Feeling of Stress :   Social Connections:   . Frequency of Communication with Friends and Family:   . Frequency of Social Gatherings with Friends and Family:   . Attends Religious Services:   . Active Member of Clubs or Organizations:   . Attends Banker Meetings:   Marland Kitchen Marital Status:   Intimate Partner Violence:   . Fear of Current or Ex-Partner:   . Emotionally Abused:   Marland Kitchen Physically Abused:   . Sexually Abused:     Objective:   Today's  Vitals: BP 111/71   Pulse 75   Temp 99.1 F (37.3 C) (Temporal)   Ht 5\' 11"  (1.803 m)   Wt 159 lb 12.8 oz (72.5 kg)   SpO2 95%   BMI 22.29 kg/m   Physical Exam Vitals reviewed. Exam conducted with a chaperone present.  Constitutional:      General: He is not in acute distress.    Appearance: Normal appearance. He is not ill-appearing, toxic-appearing or diaphoretic.  HENT:     Head: Normocephalic and atraumatic.  Eyes:     General: No scleral icterus.       Right eye: No discharge.  Left eye: No discharge.     Conjunctiva/sclera: Conjunctivae normal.  Cardiovascular:     Rate and Rhythm: Normal rate and regular rhythm.     Heart sounds: Normal heart sounds. No murmur. No friction rub. No gallop.   Pulmonary:     Effort: Pulmonary effort is normal. No respiratory distress.     Breath sounds: Normal breath sounds. No stridor. No wheezing, rhonchi or rales.  Abdominal:     Hernia: There is no hernia in the left inguinal area or right inguinal area.  Genitourinary:    Testes: Normal.     Comments: Small white bump on scrotum. No black drainage.  Musculoskeletal:        General: Normal range of motion.     Cervical back: Normal range of motion.  Skin:    General: Skin is warm and dry.  Neurological:     Mental Status: He is alert and oriented to person, place, and time. Mental status is at baseline.  Psychiatric:        Mood and Affect: Mood normal.        Behavior: Behavior normal.        Thought Content: Thought content normal.        Judgment: Judgment normal.

## 2019-10-22 ENCOUNTER — Encounter: Payer: Self-pay | Admitting: Cardiology

## 2019-10-22 ENCOUNTER — Other Ambulatory Visit: Payer: Self-pay

## 2019-10-22 ENCOUNTER — Ambulatory Visit (INDEPENDENT_AMBULATORY_CARE_PROVIDER_SITE_OTHER): Payer: Managed Care, Other (non HMO) | Admitting: Cardiology

## 2019-10-22 VITALS — BP 104/66 | HR 72 | Ht 71.0 in | Wt 162.8 lb

## 2019-10-22 DIAGNOSIS — I484 Atypical atrial flutter: Secondary | ICD-10-CM

## 2019-10-22 DIAGNOSIS — I420 Dilated cardiomyopathy: Secondary | ICD-10-CM | POA: Diagnosis not present

## 2019-10-22 NOTE — Progress Notes (Signed)
Cardiology Office Note:    Date:  10/22/2019   ID:  Cole Reynolds, DOB [birth year], MRN [id number]  PCP:  Gwenlyn Fudge, FNP  Cardiologist:  Donato Schultz, MD  Electrophysiologist:  None   Referring MD: Elfredia Nevins, MD     History of Present Illness:    Cole Reynolds is a 44 y.o. male here for the follow-up of tachycardia.  Prior CT of the heart in 2011 showed prior primary membranous VSD closure and closure of either ASD or PFO.  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  Has been fairly well controlled on medications with Toprol-XL 100 mg in the evening and 50 mg in the morning.  Also on digoxin.  He was increased stress, going through divorce, went to the emergency room 09/28/2019 with abdominal pain and tachycardia.  Heart rate variability noted.  Ultimately left AMA.  Heart rate originally was as high as 166.  EKG showed sinus tachycardia with right bundle branch block PACs.  With metoprolol IV.  He is known atrial flutter/atrial tachycardia, congenital heart defect.  Tachycardia has been as high as 210 bpm with one-to-one atrial flutter/tachycardia.  In the past has been some issues with compliance with medications.  He has demonstrated in the past 2-1 conduction of atrial tachycardia/flutter with heart rates of 110.  At prior office visits he got upset after the EKG was done and left the building.  He does not know why people keep doing EKGs on him he told my assistant.  We talked about this at today's visit and he admits that he was not sure that we would see him back but I explained to him that we are here to take care of him and all that we ask his he maintains respectfulness towards the staff.  Past Medical History:  Diagnosis Date  . Depression   . Dysrhythmia   . Hypertension   . IBS (irritable bowel syndrome)   . Migraine   . Partial epilepsy (HCC)   . Seizures (HCC)   . Third  degree hemorrhoids   . Umbilical hernia     Past Surgical History:  Procedure Laterality Date  . CARDIOVERSION N/A 12/24/2014   Procedure: CARDIOVERSION;  Surgeon: Jake Bathe, MD;  Location: [facility] ENDOSCOPY;  Service: Cardiovascular;  Laterality: N/A;  . pediatric bowel surgery    . pediatric heart surgery      Current Medications: Current Meds  Medication Sig  . cephALEXin (KEFLEX) 500 MG capsule Take 1 capsule (500 mg total) by mouth 2 (two) times daily for 7 days.  . clotrimazole (LOTRIMIN) 1 % cream Apply 1 application topically 2 (two) times daily for 14 days.  . digoxin (LANOXIN) 0.125 MG tablet Take 1 tablet by mouth once daily  . gabapentin (NEURONTIN) 300 MG capsule Take 2 capsules by mouth 2 (two) times daily.  . metoprolol succinate (TOPROL-XL) 100 MG 24 hr tablet TAKE 1 TABLET BY MOUTH ONCE DAILY IN THE EVENING WITH  OR  IMMEDIATELY  FOLLOWING  A  MEAL  . metoprolol succinate (TOPROL-XL) 50 MG 24 hr tablet TAKE 1 TABLET BY MOUTH ONCE DAILY IN THE MORNING WITH OR IMMEDIATELY FOLLOWING A MEAL  . sertraline (ZOLOFT) 100 MG tablet Take 100 mg by mouth daily.     Allergies:   Aspirin, Nsaids, and Penicillins   Social History   Socioeconomic History  . Marital status: Married    Spouse name: Not  on file  . Number of children: Not on file  . Years of education: Not on file  . Highest education level: Not on file  Occupational History  . Not on file  Tobacco Use  . Smoking status: Current Every Day Smoker    Packs/day: 1.00    Types: Cigarettes  . Smokeless tobacco: Never Used  Substance and Sexual Activity  . Alcohol use: No  . Drug use: Yes    Types: Marijuana    Comment: 10/16/19-quit x 3 weeks ago  . Sexual activity: Not Currently  Other Topics Concern  . Not on file  Social History Narrative  . Not on file   Social Determinants of Health   Financial Resource Strain:   . Difficulty of Paying Living Expenses:   Food Insecurity:   . Worried About Community education officer in the Last Year:   . Barista in the Last Year:   Transportation Needs:   . Freight forwarder (Medical):   Marland Kitchen Lack of Transportation (Non-Medical):   Physical Activity:   . Days of Exercise per Week:   . Minutes of Exercise per Session:   Stress:   . Feeling of Stress :   Social Connections:   . Frequency of Communication with Friends and Family:   . Frequency of Social Gatherings with Friends and Family:   . Attends Religious Services:   . Active Member of Clubs or Organizations:   . Attends Banker Meetings:   Marland Kitchen Marital Status:      Family History: The patient's family history includes Lung cancer in his maternal grandmother. There is no history of Heart attack or Stroke.  ROS:   Please see the history of present illness.    No fevers chills nausea vomiting syncope bleeding all other systems reviewed and are negative.  EKGs/Labs/Other Studies Reviewed:    The following studies were reviewed today: ER reviewed.  EKG: Prior EKG from emergency room reviewed as above  Recent Labs: 09/28/2019: ALT 22; B Natriuretic Peptide 55.9; BUN 6; Creatinine, Ser 1.12; Hemoglobin 14.8; Platelets 211; Potassium 3.1; Sodium 138 outside labs. Recent Lipid Panel No results found for: CHOL, TRIG, HDL, CHOLHDL, VLDL, LDLCALC, LDLDIRECT  Physical Exam:    VS:  BP 104/66   Pulse 72   Ht 5\' 11"  (1.803 m)   Wt 162 lb 12.8 oz (73.8 kg)   SpO2 95%   BMI 22.71 kg/m     Wt Readings from Last 3 Encounters:  10/22/19 162 lb 12.8 oz (73.8 kg)  10/16/19 159 lb 12.8 oz (72.5 kg)  09/28/19 185 lb (83.9 kg)     GEN:  Well nourished, well developed in no acute distress HEENT: Normal NECK: No JVD; No carotid bruits LYMPHATICS: No lymphadenopathy CARDIAC: RRR, no murmurs, rubs, gallops RESPIRATORY:  Clear to auscultation without rales, wheezing or rhonchi  ABDOMEN: Soft, non-tender, non-distended MUSCULOSKELETAL:  No edema; No deformity  SKIN: Warm and  dry NEUROLOGIC:  Alert and oriented x 3 PSYCHIATRIC:  Normal affect   ASSESSMENT:    1. Atypical atrial flutter (HCC)   2. Dilated cardiomyopathy (HCC)    PLAN:    In order of problems listed above:  Atrial flutter/atrial tachycardia -Prior had cardioversion in 2016 but quickly reverted back to atrial flutter/tachycardia.  Toprol and digoxin have helped in the past.  After prior discussion about anticoagulation, compliance prohibited use.  Thankfully, age 48.  He does get at least 1 point for CHF.  CHA2DS2-VASc of 1 -If he is ever interested, EP referral for potential ablation may be helpful.  Dilated cardiomyopathy -Likely tachycardia mediated. -Has been is 15% EF.  40% EF most recently. -Continue with beta-blocker.  In the past cost has been an issue but now he is able to get the medications.  He is doing well with this as he is taking it.  He understands the importance of compliance.  Tobacco use  - cut back to 10.  He is also exercising.  Doing well.  Medication Adjustments/Labs and Tests Ordered: Current medicines are reviewed at length with the patient today.  Concerns regarding medicines are outlined above.  No orders of the defined types were placed in this encounter.  No orders of the defined types were placed in this encounter.   Patient Instructions  Medication Instructions:  Your physician recommends that you continue on your current medications as directed. Please refer to the Current Medication list given to you today.  *If you need a refill on your cardiac medications before your next appointment, please call your pharmacy*   Follow-Up: At [facility], you and your health needs are our priority.  As part of our continuing mission to provide you with exceptional heart care, we have created designated Provider Care Teams.  These Care Teams include your primary Cardiologist (physician) and Advanced Practice Providers (APPs -  Physician Assistants and Nurse  Practitioners) who all work together to provide you with the care you need, when you need it.    Your next appointment:   6 month(s)  The format for your next appointment:   In Person  Provider:   Cecilie Kicks, NP   Other Instructions Follow up with Dr. Marlou Porch in 1 year.       Signed, Candee Furbish, MD  10/22/2019 8:54 AM    [facility]

## 2019-10-22 NOTE — Patient Instructions (Addendum)
Medication Instructions:  Your physician recommends that you continue on your current medications as directed. Please refer to the Current Medication list given to you today.  *If you need a refill on your cardiac medications before your next appointment, please call your pharmacy*   Follow-Up: At [facility], you and your health needs are our priority.  As part of our continuing mission to provide you with exceptional heart care, we have created designated Provider Care Teams.  These Care Teams include your primary Cardiologist (physician) and Advanced Practice Providers (APPs -  Physician Assistants and Nurse Practitioners) who all work together to provide you with the care you need, when you need it.    Your next appointment:   6 month(s)  The format for your next appointment:   In Person  Provider:   Nada Boozer, NP   Other Instructions Follow up with Dr. Anne Fu in 1 year.

## 2019-11-12 ENCOUNTER — Encounter: Payer: Self-pay | Admitting: *Deleted

## 2020-03-18 ENCOUNTER — Other Ambulatory Visit: Payer: Self-pay | Admitting: Cardiology

## 2020-04-20 ENCOUNTER — Encounter: Payer: Self-pay | Admitting: Family Medicine

## 2020-04-20 ENCOUNTER — Encounter: Payer: Managed Care, Other (non HMO) | Admitting: Family Medicine

## 2020-05-19 NOTE — Progress Notes (Signed)
Cardiology Office Note   Date:  05/20/2020   ID:  Cole Reynolds, DOB [birth year], MRN [id number]  PCP:  Gwenlyn Fudge, FNP  Cardiologist:  Dr. Anne Fu    Chief Complaint  Patient presents with  . Tachycardia      History of Present Illness: Cole Reynolds is a 44 y.o. male who presents for tachycardia.  Prior CT of the heart in 2011 showed prior primary membranous VSD closure and closure of either ASD or PFO.  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  Has been fairly well controlled on medications with Toprol-XL 100 mg in the evening and 50 mg in the morning.  Also on digoxin.  He was increased stress, going through divorce, went to the emergency room 09/28/2019 with abdominal pain and tachycardia.  Heart rate variability noted.  Ultimately left AMA.  Heart rate originally was as high as 166.  EKG showed sinus tachycardia with right bundle branch block PACs.  With metoprolol IV.  He is known atrial flutter/atrial tachycardia, congenital heart defect.  Tachycardia has been as high as 210 bpm with one-to-one atrial flutter/tachycardia.  In the past has been some issues with compliance with medications.  He has demonstrated in the past 2-1 conduction of atrial tachycardia/flutter with heart rates of 110.  At prior office visits he got upset after the EKG was done and left the building.  He does not know why people keep doing EKGs on him he told my assistant.  We talked about this at today's visit and he admits that he was not sure that we would see him back but I explained to him that we are here to take care of him and all that we ask his he maintains respectfulness towards the staff.  A flutter  Stable EF at 40% Today HR stays in the 60-70s unless it is elevated.  No chest pain or SOB.  No lightheadedness or dizziness.  He and his wife separated sot increase stress with that adjustment and he had  decreased tobacco but now back up to 1 PPD, plans to decrease.  Tries to exercise.    Past Medical History:  Diagnosis Date  . Depression   . Dysrhythmia   . Hypertension   . IBS (irritable bowel syndrome)   . Migraine   . Partial epilepsy (HCC)   . Seizures (HCC)   . Third degree hemorrhoids   . Umbilical hernia     Past Surgical History:  Procedure Laterality Date  . CARDIOVERSION N/A 12/24/2014   Procedure: CARDIOVERSION;  Surgeon: Jake Bathe, MD;  Location: [facility] ENDOSCOPY;  Service: Cardiovascular;  Laterality: N/A;  . pediatric bowel surgery    . pediatric heart surgery       Current Outpatient Medications  Medication Sig Dispense Refill  . digoxin (LANOXIN) 0.125 MG tablet Take 1 tablet by mouth once daily 90 tablet 0  . gabapentin (NEURONTIN) 300 MG capsule Take 2 capsules by mouth 2 (two) times daily.    . metoprolol succinate (TOPROL-XL) 100 MG 24 hr tablet TAKE 1 TABLET BY MOUTH ONCE DAILY IN THE EVENING WITH  OR  IMMEDIATELY  FOLLOWING  A  MEAL 90 tablet 0  . metoprolol succinate (TOPROL-XL) 50 MG 24 hr tablet TAKE 1 TABLET BY MOUTH ONCE DAILY IN THE MORNING WITH OR IMMEDIATELY FOLLOWING A MEAL 90 tablet 0  . sertraline (ZOLOFT) 100 MG tablet Take 100 mg by mouth  daily.     No current facility-administered medications for this visit.    Allergies:   Aspirin, Nsaids, and Penicillins    Social History:  The patient  reports that he has been smoking cigarettes. He has been smoking about 1.00 pack per day. He has never used smokeless tobacco. He reports current drug use. Drug: Marijuana. He reports that he does not drink alcohol.   Family History:  The patient's family history includes Lung cancer in his maternal grandmother.    ROS:  General:no colds or fevers, no weight changes Skin:no rashes or ulcers HEENT:no blurred vision, no congestion CV:see HPI PUL:see HPI GI:no diarrhea constipation or melena, no indigestion GU:no hematuria, no dysuria MS:no joint  pain, no claudication Neuro:no syncope, no lightheadedness Endo:no diabetes, no thyroid disease  Wt Readings from Last 3 Encounters:  05/20/20 188 lb (85.3 kg)  10/22/19 162 lb 12.8 oz (73.8 kg)  10/16/19 159 lb 12.8 oz (72.5 kg)     PHYSICAL EXAM: VS:  BP 128/68   Pulse 65   Ht 5\' 11"  (1.803 m)   Wt 188 lb (85.3 kg)   SpO2 97%   BMI 26.22 kg/m  , BMI Body mass index is 26.22 kg/m. General:Pleasant affect, NAD Skin:Warm and dry, brisk capillary refill HEENT:normocephalic, sclera clear, mucus membranes moist Neck:supple, no JVD, no bruits  Heart:S1S2 RRR with premature beats without murmur, gallup, rub or click Lungs:clear without rales, rhonchi, or wheezes , non tender, + BS, do not palpate liver spleen or masses Ext:no lower ext edema, 2+ pedal pulses, 2+ radial pulses Neuro:alert and oriented X 3, MAE, follows commands, + facial symmetry    EKG:  EKG is NOT ordered today.    Recent Labs: 09/28/2019: ALT 22; B Natriuretic Peptide 55.9; BUN 6; Creatinine, Ser 1.12; Hemoglobin 14.8; Platelets 211; Potassium 3.1; Sodium 138    Lipid Panel No results found for: CHOL, TRIG, HDL, CHOLHDL, VLDL, LDLCALC, LDLDIRECT     Other studies Reviewed: Additional studies/ records that were reviewed today include: . Echo 03/31/20  Study Conclusions   - Left ventricle: The cavity size was normal. Wall thickness was  normal. Systolic function was moderately reduced. The estimated  ejection fraction was in the range of 35% to 40%. Diffuse  hypokinesis. Left ventricular diastolic function parameters were  normal.  - Aortic valve: There was trivial regurgitation.  - Aortic root: The aortic root was mildly dilated.  - Right ventricle: The cavity size was mildly dilated.  - Right atrium: The atrium was moderately dilated.   Impressions:   - Moderate global reduction in LV function (EF 40); possible prior  VSD repair with small outpouching noted in LVOT; trace AI;    moderate RAE; mild RVE; mild TR. Compared to 4/16, LV function  has improved.   ASSESSMENT AND PLAN:  1.  Atypical atrial flutter, today regular HR with premature beats. He has a smart watch and HR mostly 60-70s.  Discussed to call if symptomatic heart racing, or slow HR.  Also to call for edema and SOB.  Hx of DCCV in 2016 but back to a flutter. CHA2DS2VASc of 1 - not interested in EP eval at this time  2.  Dilated CM improved  on last echo up from 15% to 40%.  euvolemic today  No change in meds.  Will check BMP and DIg level next week. He had already taken today.  Compliance with mes.   3.  Tobacco use back to 1PPD plans to cut back  again.     Current medicines are reviewed with the patient today.  The patient Has no concerns regarding medicines.  The following changes have been made:  See above Labs/ tests ordered today include:see above  Disposition:   FU:  see above  Signed, Nada Boozer, NP  05/20/2020 12:30 PM    [facility] [address] [place], [place], [place]  [address]/ [address] [place], [place] Phone: [phone]; Fax: [phone]  [phone]

## 2020-05-20 ENCOUNTER — Encounter: Payer: Self-pay | Admitting: Cardiology

## 2020-05-20 ENCOUNTER — Ambulatory Visit (INDEPENDENT_AMBULATORY_CARE_PROVIDER_SITE_OTHER): Payer: Managed Care, Other (non HMO) | Admitting: Cardiology

## 2020-05-20 ENCOUNTER — Other Ambulatory Visit: Payer: Self-pay

## 2020-05-20 VITALS — BP 128/68 | HR 65 | Ht 71.0 in | Wt 188.0 lb

## 2020-05-20 DIAGNOSIS — I484 Atypical atrial flutter: Secondary | ICD-10-CM | POA: Diagnosis not present

## 2020-05-20 DIAGNOSIS — Z720 Tobacco use: Secondary | ICD-10-CM

## 2020-05-20 DIAGNOSIS — I420 Dilated cardiomyopathy: Secondary | ICD-10-CM | POA: Diagnosis not present

## 2020-05-20 NOTE — Patient Instructions (Addendum)
Medication Instructions:  Your physician recommends that you continue on your current medications as directed. Please refer to the Current Medication list given to you today.  *If you need a refill on your cardiac medications before your next appointment, please call your pharmacy*   Lab Work: One day next week:  Come in between 7:30-4:30 and get A Dig level & BMET BEFORE YOU TAKE YOUR MEDICINE   If you have labs (blood work) drawn today and your tests are completely normal, you will receive your results only by: Marland Kitchen MyChart Message (if you have MyChart) OR . A paper copy in the mail If you have any lab test that is abnormal or we need to change your treatment, we will call you to review the results.   Testing/Procedures: None ordered   Follow-Up: At [facility], you and your health needs are our priority.  As part of our continuing mission to provide you with exceptional heart care, we have created designated Provider Care Teams.  These Care Teams include your primary Cardiologist (physician) and Advanced Practice Providers (APPs -  Physician Assistants and Nurse Practitioners) who all work together to provide you with the care you need, when you need it.  We recommend signing up for the patient portal called "MyChart".  Sign up information is provided on this After Visit Summary.  MyChart is used to connect with patients for Virtual Visits (Telemedicine).  Patients are able to view lab/test results, encounter notes, upcoming appointments, etc.  Non-urgent messages can be sent to your provider as well.   To learn more about what you can do with MyChart, go to [email or web address].    Your next appointment:   6 month(s)  The format for your next appointment:   In Person  Provider:   You may see Donato Schultz, MD or one of the following Advanced Practice Providers on your designated Care Team:    Norma Fredrickson, NP  Nada Boozer, NP  Georgie Chard, NP    Other  Instructions

## 2020-05-27 ENCOUNTER — Other Ambulatory Visit: Payer: Managed Care, Other (non HMO) | Admitting: *Deleted

## 2020-05-27 ENCOUNTER — Other Ambulatory Visit: Payer: Self-pay

## 2020-05-27 DIAGNOSIS — I484 Atypical atrial flutter: Secondary | ICD-10-CM

## 2020-05-27 DIAGNOSIS — I420 Dilated cardiomyopathy: Secondary | ICD-10-CM

## 2020-05-28 LAB — BASIC METABOLIC PANEL
BUN/Creatinine Ratio: 6 — ABNORMAL LOW (ref 9–20)
BUN: 6 mg/dL (ref 6–24)
CO2: 26 mmol/L (ref 20–29)
Calcium: 9.1 mg/dL (ref 8.7–10.2)
Chloride: 101 mmol/L (ref 96–106)
Creatinine, Ser: 0.95 mg/dL (ref 0.76–1.27)
GFR calc Af Amer: 111 mL/min/{1.73_m2} (ref >59)
GFR calc non Af Amer: 96 mL/min/{1.73_m2} (ref >59)
Glucose: 107 mg/dL — ABNORMAL HIGH (ref 65–99)
Potassium: 3.8 mmol/L (ref 3.5–5.2)
Sodium: 139 mmol/L (ref 134–144)

## 2020-05-28 LAB — DIGOXIN LEVEL: Digoxin, Serum: <0.4 ng/mL — ABNORMAL LOW (ref 0.5–0.9)

## 2020-06-04 ENCOUNTER — Encounter: Payer: Managed Care, Other (non HMO) | Admitting: Family Medicine

## 2020-06-10 ENCOUNTER — Ambulatory Visit (INDEPENDENT_AMBULATORY_CARE_PROVIDER_SITE_OTHER): Payer: Managed Care, Other (non HMO) | Admitting: Family Medicine

## 2020-06-10 ENCOUNTER — Other Ambulatory Visit: Payer: Self-pay

## 2020-06-10 ENCOUNTER — Encounter: Payer: Self-pay | Admitting: Family Medicine

## 2020-06-10 VITALS — BP 132/62 | HR 43 | Temp 97.5°F | Ht 71.0 in | Wt 195.4 lb

## 2020-06-10 DIAGNOSIS — Z6827 Body mass index (BMI) 27.0-27.9, adult: Secondary | ICD-10-CM

## 2020-06-10 DIAGNOSIS — Z716 Tobacco abuse counseling: Secondary | ICD-10-CM

## 2020-06-10 DIAGNOSIS — Z1329 Encounter for screening for other suspected endocrine disorder: Secondary | ICD-10-CM

## 2020-06-10 DIAGNOSIS — Z720 Tobacco use: Secondary | ICD-10-CM

## 2020-06-10 DIAGNOSIS — Z13220 Encounter for screening for lipoid disorders: Secondary | ICD-10-CM | POA: Diagnosis not present

## 2020-06-10 DIAGNOSIS — Z0001 Encounter for general adult medical examination with abnormal findings: Secondary | ICD-10-CM | POA: Diagnosis not present

## 2020-06-10 DIAGNOSIS — I420 Dilated cardiomyopathy: Secondary | ICD-10-CM

## 2020-06-10 DIAGNOSIS — Z23 Encounter for immunization: Secondary | ICD-10-CM | POA: Diagnosis not present

## 2020-06-10 DIAGNOSIS — I10 Essential (primary) hypertension: Secondary | ICD-10-CM

## 2020-06-10 DIAGNOSIS — I4819 Other persistent atrial fibrillation: Secondary | ICD-10-CM

## 2020-06-10 DIAGNOSIS — F331 Major depressive disorder, recurrent, moderate: Secondary | ICD-10-CM

## 2020-06-10 NOTE — Progress Notes (Signed)
Cole Reynolds is a 44 y.o. male presents to office today for annual physical exam examination.    Concerns today include: 1. DMV paperwork He has some paperwork that needs to be filled out for the [facility] to drive due to his history of seizures. He has not had a seizure in 7 years.   He is followed yearly by neurology for his seizure disorder. His last visit was in March of this year. He does not currently take medication for his seizures.   He is followed every 6 months by cardiology for A. Fib and cardiomyopathy. His last visit was a few weeks ago. He wears a smart watch to help keep an eye on his HR. His HR is normally 60-70s. He takes Digoxin 0.125 mg daily, and Toprol-XL 50 mg in am and 100 mg in pm. He denies chest pain, shortness of breath, dizziness, lightheadedness, syncope, edema, or palpitations.   He is followed by psychiatry for Depression. He takes Zoloft and reports that his symptoms are well controlled.   Occupation: disabled, Marital status: married, Substance use: marijuana, tobacco cigarettes 1 PPD Diet: "horrible", eats "junk, fast food, fried foods", Exercise: goes to [employer] about 1x a week Last eye exam: not recently Last dental exam: 11/2019 Refills needed today: none   Past Medical History:  Diagnosis Date  . Depression   . Dysrhythmia   . Hypertension   . IBS (irritable bowel syndrome)   . Migraine   . Partial epilepsy (HCC)   . Seizures (HCC)   . Third degree hemorrhoids   . Umbilical hernia    Social History   Socioeconomic History  . Marital status: Married    Spouse name: Not on file  . Number of children: Not on file  . Years of education: Not on file  . Highest education level: Not on file  Occupational History  . Not on file  Tobacco Use  . Smoking status: Current Every Day Smoker    Packs/day: 1.00    Types: Cigarettes  . Smokeless tobacco: Never Used  Vaping Use  . Vaping Use: Never used  Substance and Sexual Activity  .  Alcohol use: No  . Drug use: Yes    Types: Marijuana    Comment: 10/16/19-quit x 3 weeks ago  . Sexual activity: Not Currently  Other Topics Concern  . Not on file  Social History Narrative  . Not on file   Social Determinants of Health   Financial Resource Strain:   . Difficulty of Paying Living Expenses: Not on file  Food Insecurity:   . Worried About Programme researcher, broadcasting/film/video in the Last Year: Not on file  . Ran Out of Food in the Last Year: Not on file  Transportation Needs:   . Lack of Transportation (Medical): Not on file  . Lack of Transportation (Non-Medical): Not on file  Physical Activity:   . Days of Exercise per Week: Not on file  . Minutes of Exercise per Session: Not on file  Stress:   . Feeling of Stress : Not on file  Social Connections:   . Frequency of Communication with Friends and Family: Not on file  . Frequency of Social Gatherings with Friends and Family: Not on file  . Attends Religious Services: Not on file  . Active Member of Clubs or Organizations: Not on file  . Attends Banker Meetings: Not on file  . Marital Status: Not on file  Intimate Partner Violence:   .  Fear of Current or Ex-Partner: Not on file  . Emotionally Abused: Not on file  . Physically Abused: Not on file  . Sexually Abused: Not on file   Past Surgical History:  Procedure Laterality Date  . CARDIOVERSION N/A 12/24/2014   Procedure: CARDIOVERSION;  Surgeon: Jake Bathe, MD;  Location: [facility] ENDOSCOPY;  Service: Cardiovascular;  Laterality: N/A;  . pediatric bowel surgery    . pediatric heart surgery     Family History  Problem Relation Age of Onset  . Lung cancer Maternal Grandmother   . Heart attack Neg Hx   . Stroke Neg Hx     Current Outpatient Medications:  .  digoxin (LANOXIN) 0.125 MG tablet, Take 1 tablet by mouth once daily, Disp: 90 tablet, Rfl: 0 .  gabapentin (NEURONTIN) 300 MG capsule, Take 2 capsules by mouth 2 (two) times daily., Disp: , Rfl:  .   metoprolol succinate (TOPROL-XL) 100 MG 24 hr tablet, TAKE 1 TABLET BY MOUTH ONCE DAILY IN THE EVENING WITH  OR  IMMEDIATELY  FOLLOWING  A  MEAL, Disp: 90 tablet, Rfl: 0 .  metoprolol succinate (TOPROL-XL) 50 MG 24 hr tablet, TAKE 1 TABLET BY MOUTH ONCE DAILY IN THE MORNING WITH OR IMMEDIATELY FOLLOWING A MEAL, Disp: 90 tablet, Rfl: 0 .  sertraline (ZOLOFT) 100 MG tablet, Take 100 mg by mouth daily., Disp: , Rfl:   Allergies  Allergen Reactions  . Aspirin Other (See Comments)    unknown  . Nsaids Hives  . Penicillins Other (See Comments)    unknown     ROS: Review of Systems Pertinent items noted in HPI and remainder of comprehensive ROS otherwise negative.    Physical exam BP 132/62   Pulse (!) 43   Temp (!) 97.5 F (36.4 C) (Temporal)   Ht 5\' 11"  (1.803 m)   Wt 195 lb 6.4 oz (88.6 kg)   SpO2 93%   BMI 27.25 kg/m   Physical Exam Vitals and nursing note reviewed.  Constitutional:      General: He is not in acute distress.    Appearance: Normal appearance. He is not ill-appearing or toxic-appearing.  HENT:     Head: Normocephalic and atraumatic.     Right Ear: Tympanic membrane, ear canal and external ear normal.     Left Ear: Tympanic membrane, ear canal and external ear normal.     Nose: Nose normal.     Mouth/Throat:     Mouth: Mucous membranes are moist.     Pharynx: Oropharynx is clear.  Eyes:     Extraocular Movements: Extraocular movements intact.     Conjunctiva/sclera: Conjunctivae normal.     Pupils: Pupils are equal, round, and reactive to light.  Neck:     Vascular: No carotid bruit.  Cardiovascular:     Rate and Rhythm: Normal rate and regular rhythm.     Pulses: Normal pulses.     Heart sounds: Normal heart sounds. No murmur heard.   Pulmonary:     Effort: Pulmonary effort is normal. No respiratory distress.     Breath sounds: Normal breath sounds.  Abdominal:     General: Bowel sounds are normal. There is no distension.     Palpations: Abdomen is  soft. There is no mass.     Tenderness: There is no abdominal tenderness. There is no guarding or rebound.  Musculoskeletal:     Cervical back: Neck supple. No tenderness.     Right lower leg: No edema.  Left lower leg: No edema.  Lymphadenopathy:     Cervical: No cervical adenopathy.  Skin:    General: Skin is warm and dry.  Neurological:     General: No focal deficit present.     Mental Status: He is alert and oriented to person, place, and time.     Cranial Nerves: Cranial nerves are intact.     Motor: No weakness.     Gait: Gait normal.  Psychiatric:        Mood and Affect: Mood normal.        Behavior: Behavior normal.     Assessment/ Plan: Minda Meo here for annual physical exam.   Kaisyn was seen today for annual exam.  Diagnoses and all orders for this visit:  Encounter for general adult medical examination with abnormal findings Discussed diet and exercise. He will return tomorrow for fasting lab work. Labs pending as below.  -     CBC with Differential/Platelet; Future -     Lipid panel; Future -     Thyroid Panel With TSH; Future  BMI 27.0-27.9,adult Diet and exercise. Labs pending as below.  -     Lipid panel; Future -     Thyroid Panel With TSH; Future  Screening for lipoid disorders -     Lipid panel; Future  Screening for thyroid disorder -     Thyroid Panel With TSH; Future  Essential hypertension  Persistent atrial fibrillation (HCC) Congestive dilated cardiomyopathy (HCC) Managed by cardiology. BP 132/62 today.  Moderate episode of recurrent major depressive disorder [facility]) Managed by psychiatry. PHQ2 score is 0 today.  Tobacco use Tobacco abuse counseling Not ready to quit.  Discussed with patient that DMV paperwork is in reference to his mental disorder, seizure disorder, and cardiac conditions which are managed by specialist. Instructed patient to provide his specialist with the form for completion.   Flu vaccine today in  office.   Counseled on healthy lifestyle choices, including diet (rich in fruits, vegetables and lean meats and low in salt and simple carbohydrates) and exercise (at least 30 minutes of moderate physical activity daily).  Patient to follow up in 1 year for annual exam or sooner if needed.  The above assessment and management plan was discussed with the patient. The patient verbalized understanding of and has agreed to the management plan. Patient is aware to call the clinic if symptoms persist or worsen. Patient is aware when to return to the clinic for a follow-up visit. Patient educated on when it is appropriate to go to the emergency department.   Harlow Mares, FNP-C [facility] [address] [place], [place] [address] [phone]

## 2020-06-10 NOTE — Patient Instructions (Signed)

## 2020-06-21 ENCOUNTER — Telehealth: Payer: Self-pay | Admitting: Cardiology

## 2020-06-21 DIAGNOSIS — Q249 Congenital malformation of heart, unspecified: Secondary | ICD-10-CM

## 2020-06-21 DIAGNOSIS — Z0279 Encounter for issue of other medical certificate: Secondary | ICD-10-CM

## 2020-06-21 DIAGNOSIS — I420 Dilated cardiomyopathy: Secondary | ICD-10-CM

## 2020-06-21 DIAGNOSIS — I4819 Other persistent atrial fibrillation: Secondary | ICD-10-CM

## 2020-06-21 NOTE — Telephone Encounter (Signed)
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 [name] paid and signed release form. Paperwork placed in Dr. Anne Fu box

## 2020-06-22 ENCOUNTER — Other Ambulatory Visit: Payer: Self-pay

## 2020-06-22 ENCOUNTER — Ambulatory Visit (HOSPITAL_COMMUNITY): Payer: Managed Care, Other (non HMO) | Attending: Cardiovascular Disease

## 2020-06-22 DIAGNOSIS — I420 Dilated cardiomyopathy: Secondary | ICD-10-CM | POA: Insufficient documentation

## 2020-06-22 DIAGNOSIS — Q249 Congenital malformation of heart, unspecified: Secondary | ICD-10-CM | POA: Insufficient documentation

## 2020-06-22 DIAGNOSIS — I4819 Other persistent atrial fibrillation: Secondary | ICD-10-CM | POA: Diagnosis not present

## 2020-06-22 LAB — ECHOCARDIOGRAM COMPLETE
Area-P 1/2: 3.72 cm2
S' Lateral: 4.2 cm

## 2020-06-22 NOTE — Telephone Encounter (Signed)
It has been since 2016 since his last echocardiogram.  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  Diagnosis atrial flutter  Donato Schultz, MD

## 2020-06-22 NOTE — Telephone Encounter (Signed)
Attempted to contact pt.  No answer and VM has not been set up.

## 2020-06-22 NOTE — Telephone Encounter (Signed)
Pt showed up to the office and was made aware that echo needed before filling out paperwork. Pt scheduled for echo today.

## 2020-06-23 ENCOUNTER — Telehealth: Payer: Self-pay | Admitting: Cardiology

## 2020-06-23 NOTE — Telephone Encounter (Signed)
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

## 2020-06-23 NOTE — Telephone Encounter (Signed)
°   Cole Bathe, MD  06/23/2020 9:09 AM EST     Ejection fraction 45% mildly decreased but improved from remote studies.  Overall reassuring.  Donato Schultz, MD      Cole Socks, LPN  50/51/8335 9:13 AM EST     Attempted to call the pt back to endorse his echo results per Dr. Anne Reynolds, but pt did not answer and there was no voicemail set-up at this time.  Dr. Anne Reynolds also completed his DOT paperwork this morning, pending these results. Will be giving his DOT paperwork to Medical Records to complete the process and follow-up with the pt on their part.

## 2020-06-23 NOTE — Telephone Encounter (Signed)
Follow Up:      Pt is returning Ivy's call from today, concerning his Echo results.

## 2020-06-23 NOTE — Telephone Encounter (Signed)
The patient has been notified of his echo result and verbalized understanding.  All questions (if any) were answered. 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 Anne Fu advised on.  Pt states he will come around 1130 or noon today.  Will endorse this to Medical Records.

## 2020-06-23 NOTE — Telephone Encounter (Signed)
Dr. Anne Fu completed Cole Reynolds paperwork.Patient was told it is ready for pickup. Patient come to the front office to pick up Paperwork

## 2020-06-23 NOTE — Telephone Encounter (Signed)
Me     9:17 AM Note   Cole Bathe, MD  06/23/2020 9:09 AM EST     Ejection fraction 45% mildly decreased but improved from remote studies.  Overall reassuring.  Donato Schultz, MD      Loa Socks, LPN  53/66/4403 9:13 AM EST     Attempted to call the pt back to endorse his echo results per Dr. Anne Fu, but pt did not answer and there was no voicemail set-up at this time.  Dr. Anne Fu also completed his DOT paperwork this morning, pending these results. Will be giving his DOT paperwork to Medical Records to complete the process and follow-up with the pt on their part.          The patient has been notified of the result and verbalized understanding.  All questions (if any) were answered. 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 Anne Fu advised on.  Pt states he will come around 1130 or noon today.  Will endorse this to Medical Records.

## 2020-08-16 ENCOUNTER — Other Ambulatory Visit: Payer: Self-pay | Admitting: Cardiology

## 2020-08-24 ENCOUNTER — Ambulatory Visit (INDEPENDENT_AMBULATORY_CARE_PROVIDER_SITE_OTHER): Payer: Self-pay | Admitting: Family Medicine

## 2020-08-24 ENCOUNTER — Encounter: Payer: Self-pay | Admitting: Family Medicine

## 2020-08-24 DIAGNOSIS — Z0289 Encounter for other administrative examinations: Secondary | ICD-10-CM

## 2020-08-24 NOTE — Progress Notes (Signed)
Virtual Visit via Telephone Note  I connected with Cole Reynolds on 08/24/20 at 2:43 PM by telephone and verified that I am speaking with the correct person using two identifiers. Cole Reynolds is currently located in his vehicle and nobody is currently with him during this visit. The provider, Gwenlyn Fudge, FNP is located in their home at time of visit.  I discussed the limitations, risks, security and privacy concerns of performing an evaluation and management service by telephone and the availability of in person appointments. I also discussed with the patient that there may be a patient responsible charge related to this service. The patient expressed understanding and agreed to proceed.  Subjective: PCP: Gwenlyn Fudge, FNP  Chief Complaint  Patient presents with  . 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  He has already had sections completed by his cardiologist, neurologist, and therapist.  He is moving to [place] since his recent divorce and would like to get this taken care of before he leaves.   ROS: Per HPI  Current Outpatient Medications:  .  digoxin (LANOXIN) 0.125 MG tablet, Take 1 tablet by mouth once daily, Disp: 90 tablet, Rfl: 1 .  gabapentin (NEURONTIN) 300 MG capsule, Take 2 capsules by mouth 2 (two) times daily., Disp: , Rfl:  .  metoprolol succinate (TOPROL-XL) 100 MG 24 hr tablet, TAKE 1 TABLET BY MOUTH ONCE DAILY IN THE EVENING WITH  OR  IMMEDIATELY  FOLLOWING  A  MEAL, Disp: 90 tablet, Rfl: 1 .  metoprolol succinate (TOPROL-XL) 50 MG 24 hr tablet, TAKE 1 TABLET BY MOUTH IN THE MORNING WITH  OR  IMMEDIATELY  FOLLOWING  A  MEAL, Disp: 90 tablet, Rfl: 1 .  sertraline (ZOLOFT) 100 MG tablet, Take 100 mg by mouth daily., Disp: , Rfl:   Allergies  Allergen Reactions  . Aspirin Other (See Comments)    unknown  . Nsaids Hives  . Penicillins Other (See Comments)    unknown   Past  Medical History:  Diagnosis Date  . Depression   . Dysrhythmia   . Hypertension   . IBS (irritable bowel syndrome)   . Migraine   . Partial epilepsy (HCC)   . Seizures (HCC)   . Third degree hemorrhoids   . Umbilical hernia     Observations/Objective: A&O  No respiratory distress or wheezing audible over the phone Mood, judgement, and thought processes all WNL   Assessment and Plan: 1. Encounter for completion of form with patient - Patient advised to drop form off at the office for completion.   Follow Up Instructions: Return as needed.  I discussed the assessment and treatment plan with the patient. The patient was provided an opportunity to ask questions and all were answered. The patient agreed with the plan and demonstrated an understanding of the instructions.   The patient was advised to call back or seek an in-person evaluation if the symptoms worsen or if the condition fails to improve as anticipated.  The above assessment and management plan was discussed with the patient. The patient verbalized understanding of and has agreed to the management plan. Patient is aware to call the clinic if symptoms persist or worsen. Patient is aware when to return to the clinic for a follow-up visit. Patient educated on when it is appropriate to go to the emergency department.   Time call ended: 2:50 PM  I  provided 9 minutes of non-face-to-face time during this encounter.  Deliah Boston, MSN, APRN, FNP-C [facility] 08/24/20

## 2020-10-19 ENCOUNTER — Ambulatory Visit: Payer: Managed Care, Other (non HMO) | Admitting: Cardiology

## 2020-11-15 ENCOUNTER — Other Ambulatory Visit: Payer: Self-pay | Admitting: Cardiology

## 2020-11-22 ENCOUNTER — Telehealth: Payer: Self-pay | Admitting: Cardiology

## 2020-11-22 NOTE — Telephone Encounter (Signed)
Called pt to inform him that his medications were already sent to his pharmacy as requested. I advised the pt that if he has any other problems, questions or concerns, to give our office a call back. Pt verbalized understanding.

## 2020-11-22 NOTE — Telephone Encounter (Signed)
*  STAT* If patient is at the pharmacy, call can be transferred to refill team.   1. Which medications need to be refilled? (please list name of each medication and dose if known)   metoprolol succinate (TOPROL-XL) 100 MG 24 hr tablet   metoprolol succinate (TOPROL-XL) 50 MG 24 hr tablet  2. Which pharmacy/location (including street and city if local pharmacy) is medication to be sent to? [facility] [address], [place] - [address]  3. Do they need a 30 day or 90 day supply? 90   PT is all out of this medication.

## 2021-03-03 ENCOUNTER — Other Ambulatory Visit: Payer: Self-pay | Admitting: Cardiology

## 2021-03-03 ENCOUNTER — Other Ambulatory Visit: Payer: Self-pay | Admitting: *Deleted

## 2021-03-03 MED ORDER — DIGOXIN 125 MCG PO TABS: 125.0000 ug | ORAL_TABLET | Freq: Every day | ORAL | 1 refills | Status: DC

## 2021-03-09 ENCOUNTER — Encounter: Payer: Self-pay | Admitting: Cardiology

## 2021-03-09 ENCOUNTER — Other Ambulatory Visit: Payer: Self-pay

## 2021-03-09 ENCOUNTER — Ambulatory Visit (INDEPENDENT_AMBULATORY_CARE_PROVIDER_SITE_OTHER): Payer: Self-pay | Admitting: Cardiology

## 2021-03-09 VITALS — BP 120/60 | HR 44 | Ht 71.0 in | Wt 191.0 lb

## 2021-03-09 DIAGNOSIS — I420 Dilated cardiomyopathy: Secondary | ICD-10-CM

## 2021-03-09 DIAGNOSIS — I484 Atypical atrial flutter: Secondary | ICD-10-CM

## 2021-03-09 NOTE — Patient Instructions (Signed)
Medication Instructions:  Please discontinue your Digoxin.  Continue all other medications as listed.  *If you need a refill on your cardiac medications before your next appointment, please call your pharmacy*  Follow-Up: At [facility], you and your health needs are our priority.  As part of our continuing mission to provide you with exceptional heart care, we have created designated Provider Care Teams.  These Care Teams include your primary Cardiologist (physician) and Advanced Practice Providers (APPs -  Physician Assistants and Nurse Practitioners) who all work together to provide you with the care you need, when you need it.  We recommend signing up for the patient portal called "MyChart".  Sign up information is provided on this After Visit Summary.  MyChart is used to connect with patients for Virtual Visits (Telemedicine).  Patients are able to view lab/test results, encounter notes, upcoming appointments, etc.  Non-urgent messages can be sent to your provider as well.   To learn more about what you can do with MyChart, go to [email or web address].    Your next appointment:   6 month(s)  The format for your next appointment:   In Person  Provider:   You will see one of the following Advanced Practice Providers on your designated Care Team:   Nada Boozer, NP or any other NP/PA.   And Dr Anne Fu in 1 year.  Thank you for choosing [facility]!!

## 2021-03-09 NOTE — Progress Notes (Signed)
Cardiology Office Note:    Date:  03/09/2021   ID:  Cole Reynolds, DOB [birth year], MRN [id number]  PCP:  Gwenlyn Fudge, FNP  Cardiologist:  Cole Schultz, Cole Reynolds  Electrophysiologist:  None   Referring Cole Reynolds: Gwenlyn Fudge, FNP     History of Present Illness:    Cole Reynolds is a 45 y.o. male here for the follow-up of tachycardia.  Prior CT of the heart in 2011 showed prior primary membranous VSD closure and closure of either ASD or PFO.  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  Has been fairly well controlled on medications with Toprol-XL 100 mg in the evening and 50 mg in the morning.  Also on digoxin.  He was increased stress, going through divorce, went to the emergency room 09/28/2019 with abdominal pain and tachycardia.  Heart rate variability noted.  Ultimately left AMA.  Heart rate originally was as high as 166.  EKG showed sinus tachycardia with right bundle branch block PACs.  With metoprolol IV.  He is known atrial flutter/atrial tachycardia, congenital heart defect.  Tachycardia has been as high as 210 bpm with one-to-one atrial flutter/tachycardia.  In the past has been some issues with compliance with medications.  He has demonstrated in the past 2-1 conduction of atrial tachycardia/flutter with heart rates of 110.  At prior office visits he got upset after the EKG was done and left the building.  He does not know why people keep doing EKGs on him he told my assistant.  We talked about this at today's visit and he admits that he was not sure that we would see him back but I explained to him that we are here to take care of him and all that we ask his he maintains respectfulness towards the staff.  Today, he mentions he is doing well.  He recently started playing Pokemon Go and has been getting more exercise by taking walks in the park. He reports he was up all night moving items from his  house. He has not been taking 0.125 mg digoxin for a week since his prescription finished and is still doing well  He denies chest pain, shortness of breath, palpitations, lightheadedness, headaches, syncope, LE edema, orthopnea, PND.   Past Medical History:  Diagnosis Date   Depression    Dysrhythmia    Hypertension    IBS (irritable bowel syndrome)    Migraine    Partial epilepsy (HCC)    Seizures (HCC)    Third degree hemorrhoids    Umbilical hernia     Past Surgical History:  Procedure Laterality Date   CARDIOVERSION N/A 12/24/2014   Procedure: CARDIOVERSION;  Surgeon: Jake Bathe, Cole Reynolds;  Location: [facility];  Service: Cardiovascular;  Laterality: N/A;   pediatric bowel surgery     pediatric heart surgery      Current Medications: Current Meds  Medication Sig   gabapentin (NEURONTIN) 300 MG capsule Take 2 capsules by mouth 2 (two) times daily.   metoprolol succinate (TOPROL-XL) 100 MG 24 hr tablet TAKE 1 TABLET BY MOUTH ONCE DAILY IN THE EVENING WITH MEALS   metoprolol succinate (TOPROL-XL) 50 MG 24 hr tablet TAKE 1 TABLET BY MOUTH ONCE DAILY IN THE MORNING WITH FOOD OR IMMEDIATELY FOLLOWING FOOD   [DISCONTINUED] digoxin (LANOXIN) 0.125 MG tablet Take 1 tablet (125 mcg total) by mouth daily.     Allergies:   Aspirin, Nsaids, and Penicillins  Social History   Socioeconomic History   Marital status: Married    Spouse name: Not on file   Number of children: Not on file   Years of education: Not on file   Highest education level: Not on file  Occupational History   Not on file  Tobacco Use   Smoking status: Every Day    Packs/day: 1.00    Types: Cigarettes   Smokeless tobacco: Never  Vaping Use   Vaping Use: Never used  Substance and Sexual Activity   Alcohol use: No   Drug use: Yes    Types: Marijuana    Comment: 10/16/19-quit x 3 weeks ago   Sexual activity: Not Currently  Other Topics Concern   Not on file  Social History Narrative   Not on file    Social Determinants of Health   Financial Resource Strain: Not on file  Food Insecurity: Not on file  Transportation Needs: Not on file  Physical Activity: Not on file  Stress: Not on file  Social Connections: Not on file     Family History: The patient's family history includes Lung cancer in his maternal grandmother. There is no history of Heart attack or Stroke.  ROS:   Please see the history of present illness.    (+) all other systems reviewed and are negative.  EKGs/Labs/Other Studies Reviewed:    The following studies were reviewed today: ER reviewed.  Echo 06/22/2020:  IMPRESSIONS     1. No evidence of residual VSD. Left ventricular ejection fraction, by  estimation, is 40 to 45%. Left ventricular ejection fraction by 3D volume  is 50 %. The left ventricle has mildly decreased function. The left  ventricle demonstrates global  hypokinesis. The left ventricular internal cavity size was mildly dilated.  Left ventricular diastolic parameters are consistent with Grade I  diastolic dysfunction (impaired relaxation). The average left ventricular  global longitudinal strain is -20.3 %.   2. Right ventricular systolic function is mildly reduced. The right  ventricular size is mildly enlarged. There is normal pulmonary artery  systolic pressure. The estimated right ventricular systolic pressure is  29.0 mmHg.   3. Left atrial size was mildly dilated.   4. Right atrial size was mildly dilated.   5. The mitral valve is normal in structure. Trivial mitral valve  regurgitation. No evidence of mitral stenosis.   6. The tricuspid regurgitant jet is highly eccentric and appears to be  due to prolapse of the septal leaflet. Tricuspid valve regurgitation is  mild to moderate.   7. The aortic valve is tricuspid. Aortic valve regurgitation is trivial.  No aortic stenosis is present.   8. The inferior vena cava is normal in size with greater than 50%  respiratory variability,  suggesting right atrial pressure of 3 mmHg.   EKG:   03/09/2021: Sinus Bradycardia, RBBB 44 bpm Prior EKG from emergency room reviewed as above  Recent Labs: 05/27/2020: BUN 6; Creatinine, Ser 0.95; Potassium 3.8; Sodium 139 outside labs. Recent Lipid Panel No results found for: CHOL, TRIG, HDL, CHOLHDL, VLDL, LDLCALC, LDLDIRECT  Physical Exam:    VS:  BP 120/60 (BP Location: Left Arm, Patient Position: Sitting, Cuff Size: Normal)   Pulse (!) 44   Ht 5\' 11"  (1.803 m)   Wt 191 lb (86.6 kg)   SpO2 95%   BMI 26.64 kg/m     Wt Readings from Last 3 Encounters:  03/09/21 191 lb (86.6 kg)  06/10/20 195 lb 6.4 oz (88.6 kg)  05/20/20 188 lb (85.3 kg)     GEN:  Well nourished, well developed in no acute distress HEENT: Normal NECK: No JVD; No carotid bruits LYMPHATICS: No lymphadenopathy CARDIAC: RRR, no murmurs, rubs, gallops RESPIRATORY:  Clear to auscultation without rales, wheezing or rhonchi  ABDOMEN: Soft, non-tender, non-distended MUSCULOSKELETAL:  No edema; No deformity  SKIN: Warm and dry NEUROLOGIC:  Alert and oriented x 3 PSYCHIATRIC:  Normal affect   ASSESSMENT:    1. Dilated cardiomyopathy (HCC)   2. Atypical atrial flutter (HCC)     PLAN:    In order of problems listed above:  Atrial flutter/atrial tachycardia -Prior had cardioversion in 2016 but quickly reverted back to atrial flutter/tachycardia.  Toprol and digoxin have helped in the past.  Interestingly, today he is in sinus bradycardia recommend block heart rate 44 bpm.  He has not taken his digoxin for quite some time misses it frequently.  I will stop.  Continue with Toprol-XL 50 mg in the morning, 100 mg in the afternoon.  after prior discussion about anticoagulation, compliance prohibited use.  Thankfully, age 49.  He does get at least 1 point for CHF.  CHA2DS2-VASc of 1 -If he is ever interested, EP referral for potential ablation may be helpful.  Dilated cardiomyopathy -Likely tachycardia  mediated. -Has been is 15% EF.  50% EF most recently by 3D analysis on 06/22/2020 echocardiogram. -Continue with beta-blocker.  In the past cost has been an issue but now he is able to get the medications.  He is doing well with this as he is taking it.  He understands the importance of compliance.  No change in medical management.  Tobacco use  - cut back to 10.  He is also exercising.  Doing well.  He is playing Pokmon go.  Follow-up in 6 months APP  Medication Adjustments/Labs and Tests Ordered: Current medicines are reviewed at length with the patient today.  Concerns regarding medicines are outlined above.  Orders Placed This Encounter  Procedures   EKG 12-Lead    No orders of the defined types were placed in this encounter.   Patient Instructions  Medication Instructions:  Please discontinue your Digoxin.  Continue all other medications as listed.  *If you need a refill on your cardiac medications before your next appointment, please call your pharmacy*  Follow-Up: At [facility], you and your health needs are our priority.  As part of our continuing mission to provide you with exceptional heart care, we have created designated Provider Care Teams.  These Care Teams include your primary Cardiologist (physician) and Advanced Practice Providers (APPs -  Physician Assistants and Nurse Practitioners) who all work together to provide you with the care you need, when you need it.  We recommend signing up for the patient portal called "MyChart".  Sign up information is provided on this After Visit Summary.  MyChart is used to connect with patients for Virtual Visits (Telemedicine).  Patients are able to view lab/test results, encounter notes, upcoming appointments, etc.  Non-urgent messages can be sent to your provider as well.   To learn more about what you can do with MyChart, go to [email or web address].    Your next appointment:   6 month(s)  The format for your next  appointment:   In Person  Provider:   You will see one of the following Advanced Practice Providers on your designated Care Team:   Nada Boozer, NP or any other NP/PA.   And Dr Anne Fu in 1 year.  Thank you for choosing [facility]!!      I,Zite Okoli,acting as a scribe for [employer]Mark Kamill Fulbright, Cole Reynolds.,have documented all relevant documentation on the behalf of Cole SchultzMark Leilani Cespedes, Cole Reynolds,as directed by  Cole SchultzMark Rosellen Lichtenberger, Cole Reynolds while in the presence of Cole SchultzMark Shanica Castellanos, Cole Reynolds.   I, Cole SchultzMark Teckla Christiansen, Cole Reynolds, have reviewed all documentation for this visit. The documentation on 03/09/21 for the exam, diagnosis, procedures, and orders are all accurate and complete.   Signed, Cole SchultzMark Dysen Edmondson, Cole Reynolds  03/09/2021 9:39 AM    [facility]

## 2021-06-07 ENCOUNTER — Other Ambulatory Visit: Payer: Self-pay | Admitting: Cardiology

## 2021-10-11 IMAGING — CT CT ABD-PELV W/ CM
2 of 5 series · 17 of 46 positions shown, 19 images · IV contrast (omnipaque)
Comparison: CT abdomen pelvis dated 12/19/2018

CLINICAL DATA: Abdominal pain, bleeding hemorrhoids.

EXAM:
CT ABDOMEN AND PELVIS WITH CONTRAST
TECHNIQUE: Multidetector CT imaging of the abdomen and pelvis was performed
using the standard protocol following bolus administration of
intravenous contrast.
CONTRAST:  100mL OMNIPAQUE IOHEXOL 300 MG/ML  SOLN

[Series 3: abd/ pelvis 5.0 i30f 2 · axial · 0.80mm/px · z∈[+720,+1140]mm · 14 of 94 slices shown, 16 images]
[im 5/94  soft-tissue]
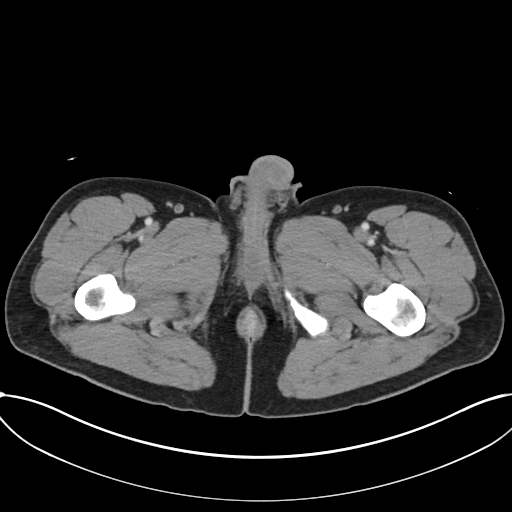
[im 5/94  bone]
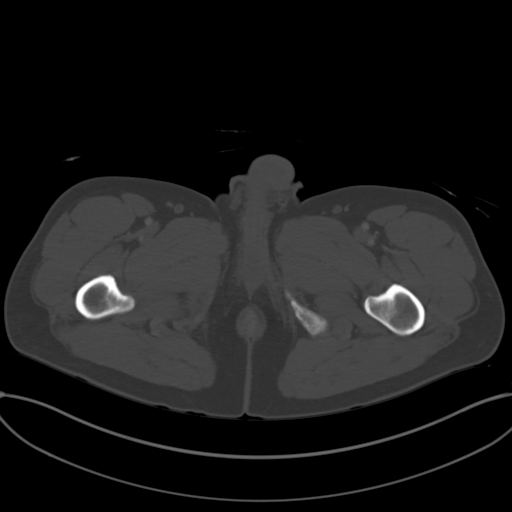
[im 10/94  soft-tissue]
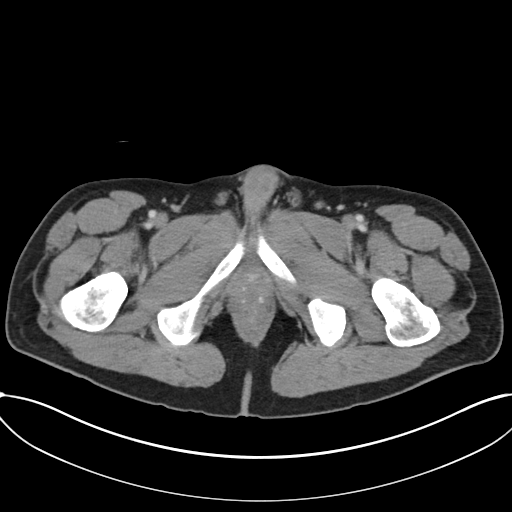
[im 20/94  soft-tissue]
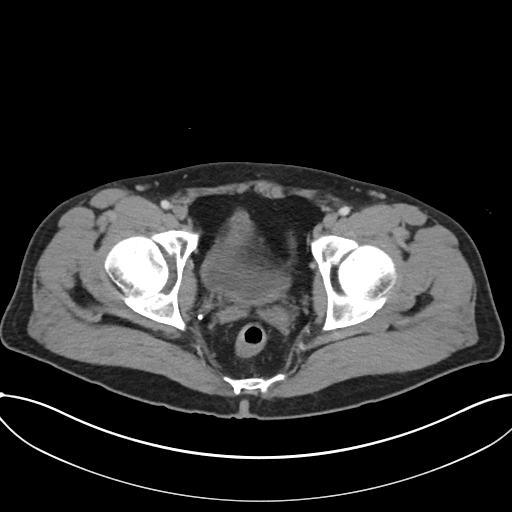
[im 25/94  soft-tissue]
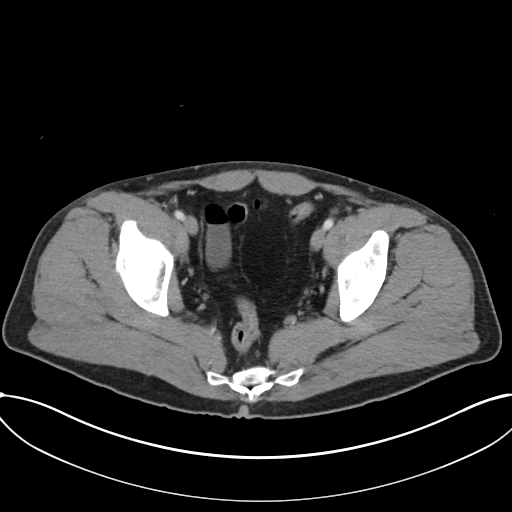
[im 30/94  soft-tissue]
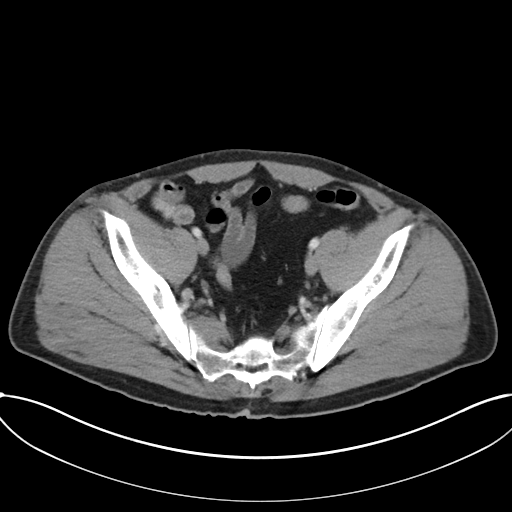
[im 40/94  soft-tissue]
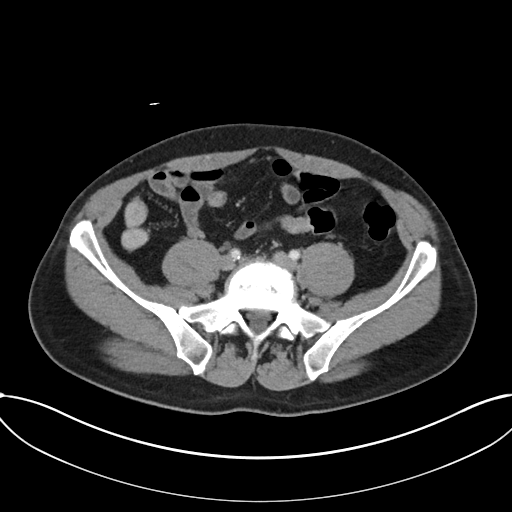
[im 45/94  soft-tissue]
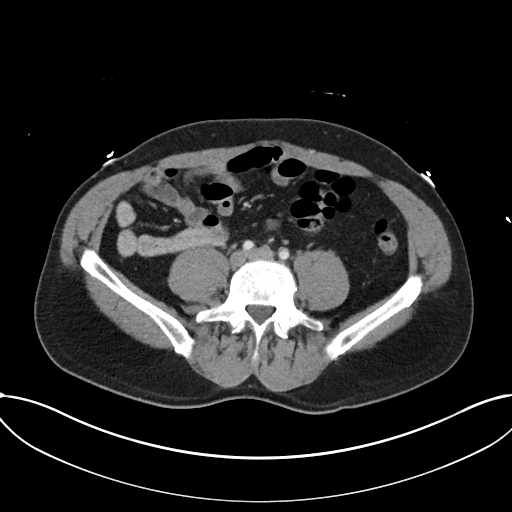
[im 49/94  soft-tissue]
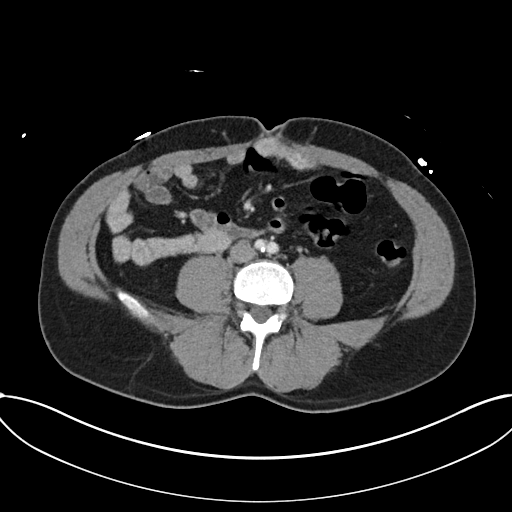
[im 54/94  soft-tissue]
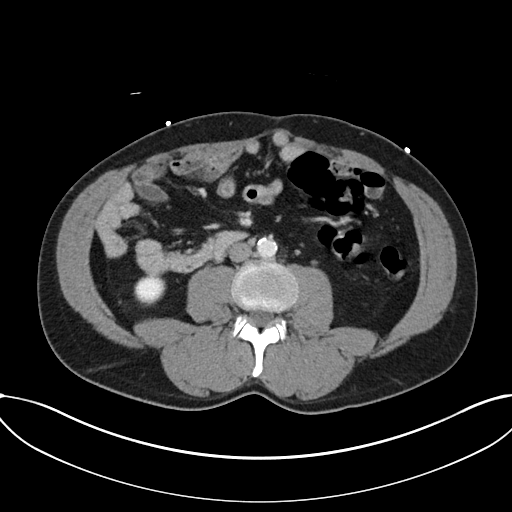
[im 54/94  bone]
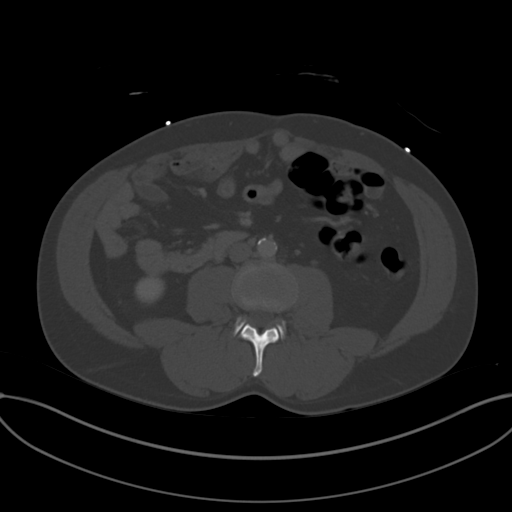
[im 64/94  soft-tissue]
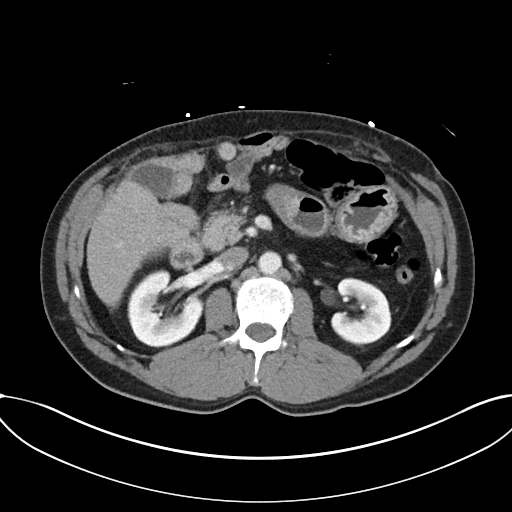
[im 69/94  soft-tissue]
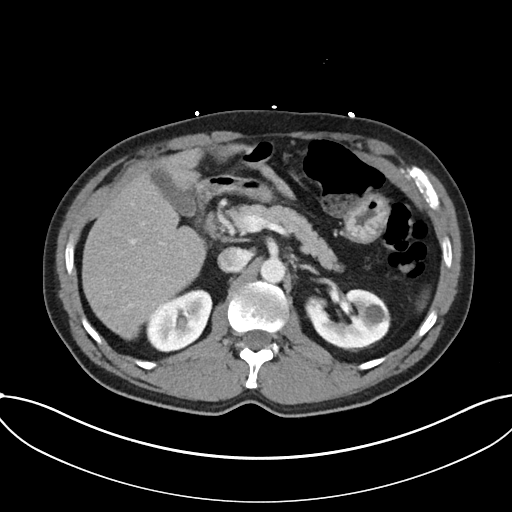
[im 74/94  soft-tissue]
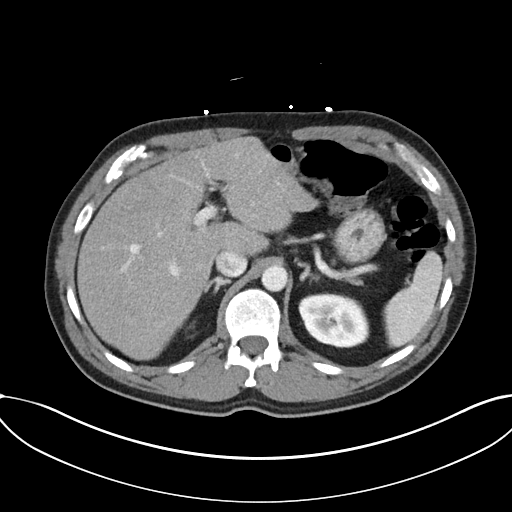
[im 84/94  soft-tissue]
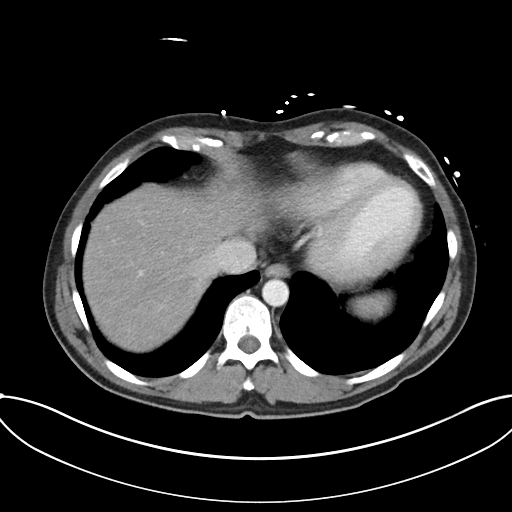
[im 89/94  soft-tissue]
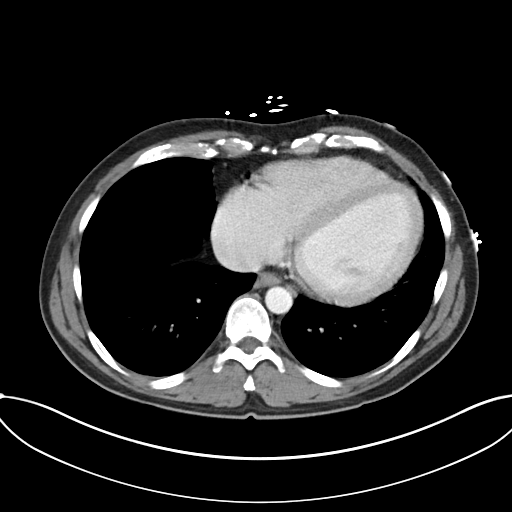

[Series 6: coronal soft tissue · coronal · 0.90mm/px · 3 of 96 slices shown]
[im 32/96  soft-tissue]
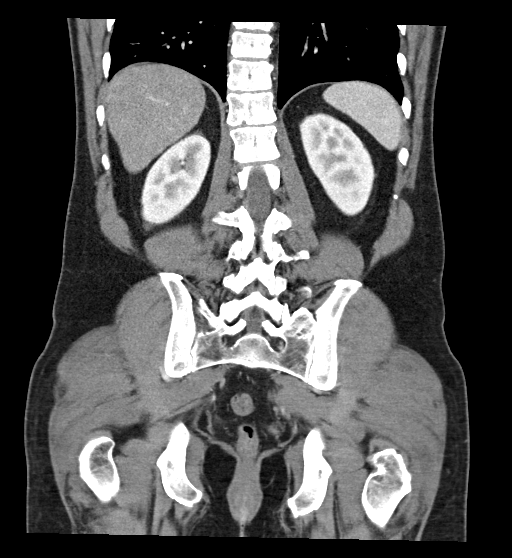
[im 43/96  soft-tissue]
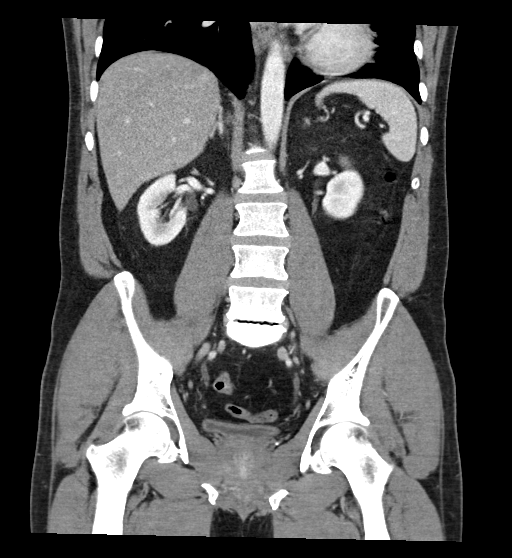
[im 53/96  soft-tissue]
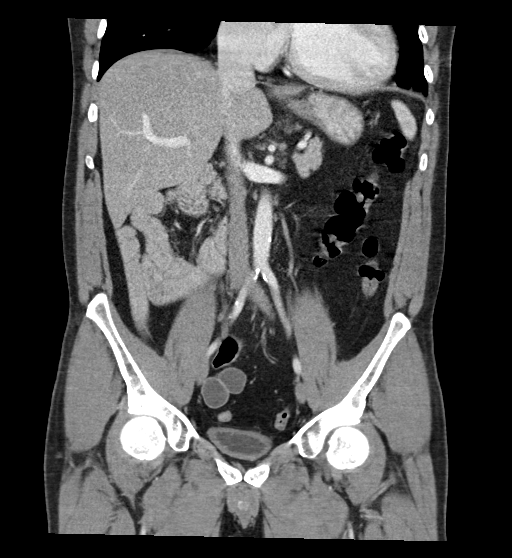

[17 of 46 positions shown; findings below may reference images not displayed]

FINDINGS: Lower chest: No acute abnormality.

Hepatobiliary: No focal liver abnormality is seen. No gallstones,
gallbladder wall thickening, or biliary dilatation.

Pancreas: Unremarkable. No pancreatic ductal dilatation or
surrounding inflammatory changes.

Spleen: Normal in size without focal abnormality.

Adrenals/Urinary Tract: Adrenal glands are unremarkable. Other than
left renal cysts measuring up to 1.5 cm, the kidneys are normal,
without renal calculi, focal lesion, or hydronephrosis. Bladder is
unremarkable.

Stomach/Bowel: Stomach is within normal limits. The large bowel is
on the left side of abdomen, suggestive of congenital malrotation.
No pericecal inflammatory changes are noted to suggest acute
appendicitis. No evidence of bowel wall thickening, distention, or
inflammatory changes.

Vascular/Lymphatic: Aortic atherosclerosis. No enlarged abdominal or
pelvic lymph nodes.

Reproductive: Prostate is unremarkable.

Other: No abdominal wall hernia or abnormality. No abdominopelvic
ascites.

Musculoskeletal: Degenerative changes are most significant at L5-S1.
IMPRESSION: 1. No acute process in the abdomen or pelvis.

Aortic Atherosclerosis (08NOF-AQ0.0).

## 2022-04-14 ENCOUNTER — Encounter: Payer: Self-pay | Admitting: Cardiology
